# Patient Record
Sex: Male | Born: 1935 | Race: Black or African American | Hispanic: No | Marital: Married | Smoking: Never smoker
Health system: Southern US, Community
[De-identification: ages and names within clinical notes are randomized; demographics above are authoritative.]

## PROBLEM LIST (undated history)

## (undated) DIAGNOSIS — F419 Anxiety disorder, unspecified: Secondary | ICD-10-CM

## (undated) DIAGNOSIS — R0789 Other chest pain: Secondary | ICD-10-CM

## (undated) DIAGNOSIS — I219 Acute myocardial infarction, unspecified: Secondary | ICD-10-CM

## (undated) DIAGNOSIS — E119 Type 2 diabetes mellitus without complications: Secondary | ICD-10-CM

## (undated) DIAGNOSIS — M1990 Unspecified osteoarthritis, unspecified site: Secondary | ICD-10-CM

## (undated) DIAGNOSIS — N400 Enlarged prostate without lower urinary tract symptoms: Secondary | ICD-10-CM

## (undated) HISTORY — PX: LUNG SURGERY: SHX703

## (undated) HISTORY — PX: CHOLECYSTECTOMY: SHX55

---

## 2002-09-23 ENCOUNTER — Emergency Department (HOSPITAL_COMMUNITY): Admission: EM | Admit: 2002-09-23 | Discharge: 2002-09-23 | Payer: Self-pay | Admitting: Emergency Medicine

## 2005-02-04 ENCOUNTER — Emergency Department (HOSPITAL_COMMUNITY): Admission: EM | Admit: 2005-02-04 | Discharge: 2005-02-04 | Payer: Self-pay | Admitting: Emergency Medicine

## 2006-07-08 ENCOUNTER — Ambulatory Visit: Payer: Self-pay | Admitting: Nurse Practitioner

## 2008-07-30 ENCOUNTER — Emergency Department (HOSPITAL_COMMUNITY): Admission: EM | Admit: 2008-07-30 | Discharge: 2008-07-30 | Payer: Self-pay | Admitting: Emergency Medicine

## 2010-03-17 ENCOUNTER — Emergency Department (HOSPITAL_COMMUNITY)
Admission: EM | Admit: 2010-03-17 | Discharge: 2010-03-17 | Payer: Self-pay | Source: Home / Self Care | Admitting: Emergency Medicine

## 2010-03-19 ENCOUNTER — Emergency Department (HOSPITAL_COMMUNITY)
Admission: EM | Admit: 2010-03-19 | Discharge: 2010-03-19 | Payer: Self-pay | Source: Home / Self Care | Admitting: Emergency Medicine

## 2010-03-26 LAB — POCT I-STAT, CHEM 8
BUN: 13 mg/dL (ref 6–23)
Calcium, Ion: 1.15 mmol/L (ref 1.12–1.32)
Chloride: 103 mEq/L (ref 96–112)
Creatinine, Ser: 1 mg/dL (ref 0.4–1.5)
Glucose, Bld: 203 mg/dL — ABNORMAL HIGH (ref 70–99)
HCT: 45 % (ref 39.0–52.0)
Hemoglobin: 15.3 g/dL (ref 13.0–17.0)
Potassium: 4 mEq/L (ref 3.5–5.1)
Sodium: 139 mEq/L (ref 135–145)
TCO2: 28 mmol/L (ref 0–100)

## 2010-06-19 LAB — GLUCOSE, CAPILLARY: Glucose-Capillary: 77 mg/dL (ref 70–99)

## 2013-07-01 ENCOUNTER — Emergency Department (HOSPITAL_COMMUNITY): Payer: Non-veteran care

## 2013-07-01 ENCOUNTER — Observation Stay (HOSPITAL_COMMUNITY)
Admission: EM | Admit: 2013-07-01 | Discharge: 2013-07-02 | Disposition: A | Discharge status: Home / Self Care | Payer: Non-veteran care | Attending: Internal Medicine | Admitting: Internal Medicine

## 2013-07-01 ENCOUNTER — Encounter (HOSPITAL_COMMUNITY): Payer: Self-pay | Admitting: Emergency Medicine

## 2013-07-01 DIAGNOSIS — R5381 Other malaise: Secondary | ICD-10-CM | POA: Insufficient documentation

## 2013-07-01 DIAGNOSIS — Z66 Do not resuscitate: Secondary | ICD-10-CM | POA: Insufficient documentation

## 2013-07-01 DIAGNOSIS — R112 Nausea with vomiting, unspecified: Secondary | ICD-10-CM | POA: Diagnosis present

## 2013-07-01 DIAGNOSIS — F419 Anxiety disorder, unspecified: Secondary | ICD-10-CM | POA: Diagnosis present

## 2013-07-01 DIAGNOSIS — R5383 Other fatigue: Secondary | ICD-10-CM

## 2013-07-01 DIAGNOSIS — R197 Diarrhea, unspecified: Secondary | ICD-10-CM | POA: Diagnosis present

## 2013-07-01 DIAGNOSIS — J189 Pneumonia, unspecified organism: Principal | ICD-10-CM | POA: Diagnosis present

## 2013-07-01 DIAGNOSIS — I10 Essential (primary) hypertension: Secondary | ICD-10-CM | POA: Insufficient documentation

## 2013-07-01 DIAGNOSIS — E119 Type 2 diabetes mellitus without complications: Secondary | ICD-10-CM | POA: Diagnosis present

## 2013-07-01 HISTORY — DX: Anxiety disorder, unspecified: F41.9

## 2013-07-01 HISTORY — DX: Benign prostatic hyperplasia without lower urinary tract symptoms: N40.0

## 2013-07-01 HISTORY — DX: Type 2 diabetes mellitus without complications: E11.9

## 2013-07-01 LAB — CBC WITH DIFFERENTIAL/PLATELET
Basophils Absolute: 0 10*3/uL (ref 0.0–0.1)
Basophils Relative: 0 % (ref 0–1)
Eosinophils Absolute: 0.1 10*3/uL (ref 0.0–0.7)
Eosinophils Relative: 1 % (ref 0–5)
HCT: 36.3 % — ABNORMAL LOW (ref 39.0–52.0)
Hemoglobin: 12.3 g/dL — ABNORMAL LOW (ref 13.0–17.0)
Lymphocytes Relative: 21 % (ref 12–46)
Lymphs Abs: 1.6 10*3/uL (ref 0.7–4.0)
MCH: 30.2 pg (ref 26.0–34.0)
MCHC: 33.9 g/dL (ref 30.0–36.0)
MCV: 89.2 fL (ref 78.0–100.0)
Monocytes Absolute: 0.8 10*3/uL (ref 0.1–1.0)
Monocytes Relative: 10 % (ref 3–12)
Neutro Abs: 5.1 10*3/uL (ref 1.7–7.7)
Neutrophils Relative %: 68 % (ref 43–77)
Platelets: 232 10*3/uL (ref 150–400)
RBC: 4.07 MIL/uL — ABNORMAL LOW (ref 4.22–5.81)
RDW: 12 % (ref 11.5–15.5)
WBC: 7.6 10*3/uL (ref 4.0–10.5)

## 2013-07-01 LAB — URINALYSIS, ROUTINE W REFLEX MICROSCOPIC
Bilirubin Urine: NEGATIVE
Glucose, UA: NEGATIVE mg/dL
Hgb urine dipstick: NEGATIVE
Ketones, ur: NEGATIVE mg/dL
Leukocytes, UA: NEGATIVE
Nitrite: NEGATIVE
Protein, ur: NEGATIVE mg/dL
Specific Gravity, Urine: <1.005 — ABNORMAL LOW (ref 1.005–1.030)
Urobilinogen, UA: 0.2 mg/dL (ref 0.0–1.0)
pH: 6 (ref 5.0–8.0)

## 2013-07-01 LAB — COMPREHENSIVE METABOLIC PANEL
ALT: 14 U/L (ref 0–53)
AST: 19 U/L (ref 0–37)
Albumin: 3.4 g/dL — ABNORMAL LOW (ref 3.5–5.2)
Alkaline Phosphatase: 73 U/L (ref 39–117)
BUN: 7 mg/dL (ref 6–23)
CO2: 29 mEq/L (ref 19–32)
Calcium: 9.4 mg/dL (ref 8.4–10.5)
Chloride: 102 mEq/L (ref 96–112)
Creatinine, Ser: 0.86 mg/dL (ref 0.50–1.35)
GFR calc Af Amer: 82 mL/min — ABNORMAL LOW (ref >90)
GFR calc non Af Amer: 71 mL/min — ABNORMAL LOW (ref >90)
Glucose, Bld: 151 mg/dL — ABNORMAL HIGH (ref 70–99)
Potassium: 3.7 mEq/L (ref 3.7–5.3)
Sodium: 142 mEq/L (ref 137–147)
Total Bilirubin: 0.4 mg/dL (ref 0.3–1.2)
Total Protein: 7.7 g/dL (ref 6.0–8.3)

## 2013-07-01 LAB — GLUCOSE, CAPILLARY
Glucose-Capillary: 181 mg/dL — ABNORMAL HIGH (ref 70–99)
Glucose-Capillary: 115 mg/dL — ABNORMAL HIGH (ref 70–99)

## 2013-07-01 LAB — LIPASE, BLOOD: Lipase: 25 U/L (ref 11–59)

## 2013-07-01 MED ORDER — TAMSULOSIN HCL 0.4 MG PO CAPS: 0.4000 mg | ORAL_CAPSULE | Freq: Every day | ORAL | Status: DC

## 2013-07-01 MED ORDER — SERTRALINE HCL 25 MG PO TABS
12.5000 mg | ORAL_TABLET | Freq: Every day | ORAL | Status: DC
  Administered 2013-07-01 – 2013-07-02 (×2): 12.5 mg via ORAL
  Filled 2013-07-01 (×5): qty 0.5

## 2013-07-01 MED ORDER — DEXTROSE 5 % IV SOLN
1.0000 g | Freq: Once | INTRAVENOUS | Status: AC
  Administered 2013-07-01: 1 g via INTRAVENOUS
  Filled 2013-07-01: qty 10

## 2013-07-01 MED ORDER — SODIUM CHLORIDE 0.9 % IV SOLN
INTRAVENOUS | Status: DC
Start: 2013-07-01 — End: 2013-07-02
  Administered 2013-07-01: 16:00:00 via INTRAVENOUS

## 2013-07-01 MED ORDER — INSULIN ASPART 100 UNIT/ML ~~LOC~~ SOLN
0.0000 [IU] | Freq: Three times a day (TID) | SUBCUTANEOUS | Status: DC
  Administered 2013-07-01: 2 [IU] via SUBCUTANEOUS

## 2013-07-01 MED ORDER — ONDANSETRON HCL 4 MG PO TABS: 4.0000 mg | ORAL_TABLET | Freq: Four times a day (QID) | ORAL | Status: DC | PRN

## 2013-07-01 MED ORDER — ACETAMINOPHEN 650 MG RE SUPP: 650.0000 mg | Freq: Four times a day (QID) | RECTAL | Status: DC | PRN

## 2013-07-01 MED ORDER — FINASTERIDE 5 MG PO TABS: 5.0000 mg | ORAL_TABLET | Freq: Every day | ORAL | Status: DC

## 2013-07-01 MED ORDER — GABAPENTIN 400 MG PO CAPS
400.0000 mg | ORAL_CAPSULE | Freq: Every day | ORAL | Status: DC
  Administered 2013-07-01: 400 mg via ORAL
  Filled 2013-07-01: qty 1

## 2013-07-01 MED ORDER — ALUM & MAG HYDROXIDE-SIMETH 200-200-20 MG/5ML PO SUSP
30.0000 mL | Freq: Four times a day (QID) | ORAL | Status: DC | PRN
  Administered 2013-07-02: 30 mL via ORAL
  Filled 2013-07-01: qty 30

## 2013-07-01 MED ORDER — SODIUM CHLORIDE 0.9 % IV BOLUS (SEPSIS)
500.0000 mL | Freq: Once | INTRAVENOUS | Status: AC
  Administered 2013-07-01: 500 mL via INTRAVENOUS

## 2013-07-01 MED ORDER — FINASTERIDE 5 MG PO TABS
5.0000 mg | ORAL_TABLET | Freq: Every day | ORAL | Status: DC
Start: 2013-07-01 — End: 2013-07-02
  Administered 2013-07-01 – 2013-07-02 (×2): 5 mg via ORAL
  Filled 2013-07-01 (×5): qty 1

## 2013-07-01 MED ORDER — ENOXAPARIN SODIUM 40 MG/0.4ML ~~LOC~~ SOLN
40.0000 mg | SUBCUTANEOUS | Status: DC
  Administered 2013-07-01: 40 mg via SUBCUTANEOUS
  Filled 2013-07-01: qty 0.4

## 2013-07-01 MED ORDER — ONDANSETRON HCL 4 MG/2ML IJ SOLN
4.0000 mg | Freq: Once | INTRAMUSCULAR | Status: AC
  Administered 2013-07-01: 4 mg via INTRAVENOUS
  Filled 2013-07-01: qty 2

## 2013-07-01 MED ORDER — MAGNESIUM CITRATE PO SOLN: 1.0000 | Freq: Once | ORAL | Status: AC | PRN

## 2013-07-01 MED ORDER — ACETAMINOPHEN 325 MG PO TABS: 650.0000 mg | ORAL_TABLET | Freq: Four times a day (QID) | ORAL | Status: DC | PRN

## 2013-07-01 MED ORDER — ONDANSETRON HCL 4 MG/2ML IJ SOLN: 4.0000 mg | Freq: Four times a day (QID) | INTRAMUSCULAR | Status: DC | PRN

## 2013-07-01 MED ORDER — DEXTROSE 5 % IV SOLN
500.0000 mg | Freq: Once | INTRAVENOUS | Status: AC
  Administered 2013-07-01: 500 mg via INTRAVENOUS

## 2013-07-01 MED ORDER — TAMSULOSIN HCL 0.4 MG PO CAPS
0.4000 mg | ORAL_CAPSULE | Freq: Every day | ORAL | Status: DC
  Administered 2013-07-01 – 2013-07-02 (×2): 0.4 mg via ORAL
  Filled 2013-07-01 (×2): qty 1

## 2013-07-01 MED ORDER — HYDROCODONE-ACETAMINOPHEN 5-325 MG PO TABS: 1.0000 | ORAL_TABLET | ORAL | Status: DC | PRN

## 2013-07-01 MED ORDER — SENNA 8.6 MG PO TABS
1.0000 | ORAL_TABLET | Freq: Two times a day (BID) | ORAL | Status: DC
  Administered 2013-07-01 – 2013-07-02 (×3): 8.6 mg via ORAL
  Filled 2013-07-01 (×3): qty 1

## 2013-07-01 MED ORDER — CEFTRIAXONE SODIUM 1 G IJ SOLR
1.0000 g | INTRAMUSCULAR | Status: DC
  Administered 2013-07-02: 1 g via INTRAVENOUS
  Filled 2013-07-01 (×4): qty 10

## 2013-07-01 MED ORDER — INSULIN ASPART 100 UNIT/ML ~~LOC~~ SOLN: 0.0000 [IU] | Freq: Every day | SUBCUTANEOUS | Status: DC

## 2013-07-01 MED ORDER — DEXTROSE 5 % IV SOLN
500.0000 mg | INTRAVENOUS | Status: DC
  Administered 2013-07-02: 500 mg via INTRAVENOUS
  Filled 2013-07-01 (×4): qty 500

## 2013-07-01 MED ORDER — SODIUM CHLORIDE 0.9 % IV BOLUS (SEPSIS)
500.0000 mL | Freq: Once | INTRAVENOUS | Status: AC
Start: 2013-07-01 — End: 2013-07-01
  Administered 2013-07-01: 500 mL via INTRAVENOUS

## 2013-07-01 NOTE — ED Notes (Signed)
Secretary called on 300 to reach RN taking patient. Report finally given.

## 2013-07-01 NOTE — Progress Notes (Signed)
ANTIBIOTIC CONSULT NOTE - INITIAL  Pharmacy Consult for Rocephin Indication: pneumonia  No Known Allergies  Patient Measurements: Height: 5\' 11"  (180.3 cm) Weight: 160 lb (72.576 kg) IBW/kg (Calculated) : 75.3 Adjusted Body Weight:   Vital Signs: Temp: 97.8 F (36.6 C) (04/23 0723) Temp src: Oral (04/23 0723) BP: 145/70 mmHg (04/23 1200) Pulse Rate: 69 (04/23 1200) Intake/Output from previous day:   Intake/Output from this shift:    Labs:  Recent Labs  07/01/13 0736  WBC 7.6  HGB 12.3*  PLT 232  CREATININE 0.86   Estimated Creatinine Clearance: 51.6 ml/min (by C-G formula based on Cr of 0.86). No results found for this basename: VANCOTROUGH, VANCOPEAK, VANCORANDOM, GENTTROUGH, GENTPEAK, GENTRANDOM, TOBRATROUGH, TOBRAPEAK, TOBRARND, AMIKACINPEAK, AMIKACINTROU, AMIKACIN,  in the last 72 hours   Microbiology: No results found for this or any previous visit (from the past 720 hour(s)).  Medical History: Past Medical History  Diagnosis Date  . Diabetes mellitus without complication   . Anxiety   . BPH (benign prostatic hyperplasia)     Medications:  Scheduled:   Assessment: Rocephin for CAP Rocephin 1 GM IV given in ED Azithromycin 500 mg IV given in ED  Goal of Therapy:  Eradicate infection  Plan:  Rocephin 1 GM IV every 24 hours, next dose tomorrow 9 AM Labs per protocol F/U LOT  Blake Hester [employer] 07/01/2013,1:00 PM

## 2013-07-01 NOTE — ED Notes (Addendum)
RN Ginger called to give report put on hold by Charge Nurse Misty Stanley

## 2013-07-01 NOTE — ED Provider Notes (Signed)
CSN: [id number]     Arrival date & time 07/01/13  0702 History  This chart was scribed for Donnetta HutchingBrian Maley Venezia, MD by Quintella ReichertMatthew Underwood, ED scribe.  This patient was seen in room APA09/APA09 and the patient's care was started at 7:23 AM.   Chief Complaint  Patient presents with  . Cough  . Emesis  . Diarrhea    The history is provided by the patient. No language interpreter was used.    HPI Comments: Blake Hester is a 80 y.o. male who presents to the Emergency Department complaining of one week of persistent, worsening cough with associated nausea, vomiting and diarrhea.  Pt also reports he has had no appetite.  He denies abdominal pain.  He has been able to walk using a walker but was brought into the ED via wheelchair with his son.  Pt lives at home with his son. Severity is moderate. No chest pain, fever, chills, rusty sputum.  [facility]   Past Medical History  Diagnosis Date  . Diabetes mellitus without complication   . Anxiety     Past Surgical History  Procedure Laterality Date  . Cholecystectomy      History reviewed. No pertinent family history.   History  Substance Use Topics  . Smoking status: Never Smoker   . Smokeless tobacco: Not on file  . Alcohol Use: No     Review of Systems A complete 10 system review of systems was obtained and all systems are negative except as noted in the HPI and PMH.     Allergies  Review of patient's allergies indicates no known allergies.  Home Medications   Prior to Admission medications   Not on File   BP 154/65  Pulse 75  Temp(Src) 97.8 F (36.6 C) (Oral)  Resp 21  Ht 5\' 11"  (1.803 m)  Wt 160 lb (72.576 kg)  BMI 22.33 kg/m2  SpO2 98%  Physical Exam  Nursing note and vitals reviewed. Constitutional: He is oriented to person, place, and time. He appears well-developed and well-nourished.  HENT:  Head: Normocephalic and atraumatic.  Eyes: Conjunctivae and EOM are normal. Pupils are equal, round, and  reactive to light.  Neck: Normal range of motion. Neck supple.  Cardiovascular: Normal rate, regular rhythm and normal heart sounds.   Pulmonary/Chest: Effort normal and breath sounds normal. No respiratory distress. He has no wheezes. He has no rales.  Abdominal: Soft. Bowel sounds are normal. There is no tenderness.  Musculoskeletal: Normal range of motion.  Neurological: He is alert and oriented to person, place, and time.  Skin: Skin is warm and dry.  Psychiatric: He has a normal mood and affect. His behavior is normal.    ED Course  Procedures (including critical care time)  DIAGNOSTIC STUDIES: Oxygen Saturation is 98% on room air, normal by my interpretation.    COORDINATION OF CARE: 7:25 AM-Discussed treatment plan which includes IV fluids, labs, and CXR with pt at bedside and pt agreed to plan.    Results for orders placed during the hospital encounter of 07/01/13  COMPREHENSIVE METABOLIC PANEL      Result Value Ref Range   Sodium 142  137 - 147 mEq/L   Potassium 3.7  3.7 - 5.3 mEq/L   Chloride 102  96 - 112 mEq/L   CO2 29  19 - 32 mEq/L   Glucose, Bld 151 (*) 70 - 99 mg/dL   BUN 7  6 - 23 mg/dL   Creatinine, Ser  0.86  0.50 - 1.35 mg/dL   Calcium 9.4  8.4 - 38.4 mg/dL   Total Protein 7.7  6.0 - 8.3 g/dL   Albumin 3.4 (*) 3.5 - 5.2 g/dL   AST 19  0 - 37 U/L   ALT 14  0 - 53 U/L   Alkaline Phosphatase 73  39 - 117 U/L   Total Bilirubin 0.4  0.3 - 1.2 mg/dL   GFR calc non Af Amer 71 (*) >90 mL/min   GFR calc Af Amer 82 (*) >90 mL/min  CBC WITH DIFFERENTIAL      Result Value Ref Range   WBC 7.6  4.0 - 10.5 K/uL   RBC 4.07 (*) 4.22 - 5.81 MIL/uL   Hemoglobin 12.3 (*) 13.0 - 17.0 g/dL   HCT 66.5 (*) 99.3 - 57.0 %   MCV 89.2  78.0 - 100.0 fL   MCH 30.2  26.0 - 34.0 pg   MCHC 33.9  30.0 - 36.0 g/dL   RDW 17.7  93.9 - 03.0 %   Platelets 232  150 - 400 K/uL   Neutrophils Relative % 68  43 - 77 %   Neutro Abs 5.1  1.7 - 7.7 K/uL   Lymphocytes Relative 21  12 - 46 %    Lymphs Abs 1.6  0.7 - 4.0 K/uL   Monocytes Relative 10  3 - 12 %   Monocytes Absolute 0.8  0.1 - 1.0 K/uL   Eosinophils Relative 1  0 - 5 %   Eosinophils Absolute 0.1  0.0 - 0.7 K/uL   Basophils Relative 0  0 - 1 %   Basophils Absolute 0.0  0.0 - 0.1 K/uL  LIPASE, BLOOD      Result Value Ref Range   Lipase 25  11 - 59 U/L    Dg Chest 1 View  07/01/2013   CLINICAL DATA:  Cough  EXAM: CHEST - 1 VIEW  COMPARISON:  Prior chest x-ray 03/17/2010  FINDINGS: Stable cardiac and mediastinal contours. Atherosclerotic calcification noted in the transverse aorta. New small left pleural effusion with associated basilar opacity. Negative for pneumothorax or pleural effusion. No suspicious nodule or mass. No acute osseous abnormality.  IMPRESSION: 1. Small left pleural effusion and associated left basilar opacity which may reflect atelectasis or infiltrate. 2. Aortic atherosclerosis.   Electronically Signed   By: Malachy Moan M.D.   On: 07/01/2013 08:04     MDM   Final diagnoses:  CAP (community acquired pneumonia)    Patient is elderly. Chest x-ray suggests a left basilar infiltrate. Rx IV Rocephin, IV Zithromax for community-acquired pneumonia. Admit    I personally performed the services described in this documentation, which was scribed in my presence. The recorded information has been reviewed and is accurate.   Donnetta Hutching, MD 07/01/13 [phone]

## 2013-07-01 NOTE — ED Notes (Signed)
C/o coughing, vomiting and diarrhea times one week.  Denies any pain at present time.  States his chest does hurt sometimes when he coughs.

## 2013-07-01 NOTE — H&P (Signed)
Patient seen and examined. Above note reviewed.  He is a very pleasant 80 year old gentleman who appears younger than his stated age. Patient has been having shortness of breath, cough, nausea vomiting for several days. Chest x-ray indicates possible developing pneumonia. He was started on empiric antibiotics. The patient is already feeling significantly improved. He likely has an element of dehydration which is contributing to his symptoms. We'll continue to monitor the patient overnight. If he continues to improve, anticipate discharge home tomorrow.  Blake Hester

## 2013-07-01 NOTE — ED Notes (Signed)
Spoke with Charge Nurse Misty Stanley bed number put in.

## 2013-07-01 NOTE — H&P (Signed)
[facility] History and Physical  Blake Hester SWN:[id number] DOB: 1915/11/09 DOA: 07/01/2013  Referring physician: cook PCP: PROVIDER NOT IN SYSTEM   Chief Complaint: cough/nausea/vomiting/diarrhe  HPI: Blake Hester is a very pleasant 80 y.o. 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 Associated symptoms include nausea with multiple episodes of vomiting last night as well as diarrhea and decreased by mouth intake. Patient states he was feeling "poorly" for the last several days but last night worsened with the vomiting and diarrhea. He reports no sleep last night. He also reports needing to use his walker to ambulate whereas he usually uses a cane. He denies chest pain palpitations headache visual disturbances numbness or tingling of his extremities. He denies fever chills dysuria hematuria frequency or urgency. He denies abdominal pain. Initial workup in the emergency department includes chest x-ray concerning for possible pneumonia. Basic metabolic panel and complete blood count mostly unremarkable. He is afebrile, hemodynamically stable and not hypoxic. In the emergency department he received 500 mg of Zithromax intravenously, 1 g of Rocephin intravenously, 1 L of normal saline.    Review of Systems:  And point review of systems complete all systems are negative except as indicated in the history of present illness   Past Medical History  Diagnosis Date  . Diabetes mellitus without complication   . Anxiety    Past Surgical History  Procedure Laterality Date  . Cholecystectomy     Social History:  reports that he has never smoked. He does not have any smokeless tobacco history on file. He reports that he does not drink alcohol or use illicit drugs. He is retired. He lives with his son. He usually ambulates with a cane. No Known  Allergies  History reviewed. No pertinent family history. family medical history reviewed and is noncontributory to the admission of this elderly gentleman  Prior to Admission medications   Not on File   Physical Exam: Filed Vitals:   07/01/13 0958  BP: 146/74  Pulse: 70  Temp:   Resp: 20    BP 146/74  Pulse 70  Temp(Src) 97.8 F (36.6 C) (Oral)  Resp 20  Ht 5\' 11"  (1.803 m)  Wt 72.576 kg (160 lb)  BMI 22.33 kg/m2  SpO2 95%  General: Somewhat thin and frail  Appears slightly anxious but in no acute distress Eyes: PERRL, normal lids, irises & conjunctiva ENT: grossly normal hearing, lips & tongue Neck: no LAD, masses or thyromegaly Cardiovascular: RRR, no m/r/g. No LE edema.  Respiratory: Normal effort. Breath sounds are slightly distant but clear. Frequent wet sounding but nonproductive cough during exam. Poor cough effort Abdomen: soft, ntnd positive bowel sounds throughout no guarding or rebound in Skin: no rash or induration seen on limited exam Musculoskeletal: grossly normal tone BUE/BLE Psychiatric: Slightly anxious. 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 Speech is clear however he has no teeth so a somewhat difficult to understand. Oriented x3           Labs on Admission:  Basic Metabolic Panel:  Recent Labs Lab 07/01/13 0736  NA 142  K 3.7  CL 102  CO2 29  GLUCOSE 151*  BUN 7  CREATININE 0.86  CALCIUM 9.4   Liver Function Tests:  Recent Labs Lab 07/01/13 0736  AST 19  ALT 14  ALKPHOS 73  BILITOT 0.4  PROT 7.7  ALBUMIN 3.4*    Recent Labs Lab 07/01/13 0736  LIPASE 25   No results found for this basename: AMMONIA,  in the last 168 hours CBC:  Recent Labs Lab 07/01/13 0736  WBC 7.6  NEUTROABS 5.1  HGB 12.3*  HCT 36.3*  MCV 89.2  PLT 232   Cardiac Enzymes: No results found for this basename: CKTOTAL, CKMB, CKMBINDEX, TROPONINI,  in the last 168 hours  BNP (last 3 results) No  results found for this basename: PROBNP,  in the last 8760 hours CBG: No results found for this basename: GLUCAP,  in the last 168 hours  Radiological Exams on Admission: Dg Chest 1 View  07/01/2013   CLINICAL DATA:  Cough  EXAM: CHEST - 1 VIEW  COMPARISON:  Prior chest x-ray 03/17/2010  FINDINGS: Stable cardiac and mediastinal contours. Atherosclerotic calcification noted in the transverse aorta. New small left pleural effusion with associated basilar opacity. Negative for pneumothorax or pleural effusion. No suspicious nodule or mass. No acute osseous abnormality.  IMPRESSION: 1. Small left pleural effusion and associated left basilar opacity which may reflect atelectasis or infiltrate. 2. Aortic atherosclerosis.   Electronically Signed   By: Malachy MoanHeath  McCullough M.D.   On: 07/01/2013 08:04    EKG:   Assessment/Plan Principal Problem:   CAP (community acquired pneumonia): Will admit to the medical floor. Will continue azithromycin and Rocephin that was started in the emergency department. Will obtain strep pneumo urine antigen and legionella urine antigen. Provide oxygen supplementation as indicated. Active Problems:   Nausea & vomiting: Seems improved during his time in the emergency department. Likely related to #1. Provide diet. Zofran as needed.    Diarrhea: No episode since presenting to the emergency department. No recent antibiotics unusual foods or travel. Suspect it's related to #1. Will monitor and obtained GI pathogen panel.    Diabetes mellitus without complication: Will obtain hemoglobin A1c. Patient reports he takes a "sugar pill". His [facility] care [facility]. Will use sliding scale for optimal glycemic control.    Anxiety: Patient reports he is on and "anxiety" pill at home. Will obtain his home medications from his son Blake Hester. He also verbalizes flashbacks last night of when he was in [place] during or [place]. Very teary during this  discussion. I will continue antianxiety agents from his home medication list    Code Status: DNR.  Family Communication: son Blake Hester at bedside Disposition Plan: home when ready  Time spent: 360  minutes  Lesle ChrisKaren M [facility] Pager [phone]

## 2013-07-01 NOTE — ED Notes (Signed)
Consult to hospitalist

## 2013-07-02 LAB — CBC
HCT: 33.4 % — ABNORMAL LOW (ref 39.0–52.0)
Hemoglobin: 11.1 g/dL — ABNORMAL LOW (ref 13.0–17.0)
MCH: 29.8 pg (ref 26.0–34.0)
MCHC: 33.2 g/dL (ref 30.0–36.0)
MCV: 89.5 fL (ref 78.0–100.0)
PLATELETS: 248 10*3/uL (ref 150–400)
RBC: 3.73 MIL/uL — ABNORMAL LOW (ref 4.22–5.81)
RDW: 12.2 % (ref 11.5–15.5)
WBC: 6.8 10*3/uL (ref 4.0–10.5)

## 2013-07-02 LAB — BASIC METABOLIC PANEL
BUN: 6 mg/dL (ref 6–23)
CALCIUM: 8.9 mg/dL (ref 8.4–10.5)
CO2: 24 mEq/L (ref 19–32)
CREATININE: 0.83 mg/dL (ref 0.50–1.35)
Chloride: 105 mEq/L (ref 96–112)
GFR calc Af Amer: 83 mL/min — ABNORMAL LOW (ref >90)
GFR calc non Af Amer: 72 mL/min — ABNORMAL LOW (ref >90)
GLUCOSE: 121 mg/dL — AB (ref 70–99)
Potassium: 3.7 mEq/L (ref 3.7–5.3)
Sodium: 143 mEq/L (ref 137–147)

## 2013-07-02 LAB — GLUCOSE, CAPILLARY
Glucose-Capillary: 112 mg/dL — ABNORMAL HIGH (ref 70–99)
Glucose-Capillary: 114 mg/dL — ABNORMAL HIGH (ref 70–99)

## 2013-07-02 LAB — HEMOGLOBIN A1C
Hgb A1c MFr Bld: 6.4 % — ABNORMAL HIGH (ref <5.7)
Mean Plasma Glucose: 137 mg/dL — ABNORMAL HIGH (ref <117)

## 2013-07-02 LAB — STREP PNEUMONIAE URINARY ANTIGEN: STREP PNEUMO URINARY ANTIGEN: NEGATIVE

## 2013-07-02 MED ORDER — GABAPENTIN 400 MG PO CAPS: 400.0000 mg | ORAL_CAPSULE | Freq: Every day | ORAL | Status: AC

## 2013-07-02 MED ORDER — LEVOFLOXACIN 750 MG PO TABS: 750.0000 mg | ORAL_TABLET | Freq: Every day | ORAL | Status: DC

## 2013-07-02 MED ORDER — TAMSULOSIN HCL 0.4 MG PO CAPS: 0.4000 mg | ORAL_CAPSULE | Freq: Every day | ORAL | Status: AC

## 2013-07-02 MED ORDER — HYDROCODONE-ACETAMINOPHEN 5-325 MG PO TABS: 1.0000 | ORAL_TABLET | ORAL | Status: AC | PRN

## 2013-07-02 MED ORDER — SERTRALINE HCL 25 MG PO TABS: 12.5000 mg | ORAL_TABLET | Freq: Every day | ORAL | Status: AC

## 2013-07-02 MED ORDER — FINASTERIDE 5 MG PO TABS: 5.0000 mg | ORAL_TABLET | Freq: Every day | ORAL | Status: AC

## 2013-07-02 NOTE — Progress Notes (Signed)
Patient discharged with instructions, prescriptions.  She verbalized understanding via teach back method.  The patient left the floor with staff via w/c in stable condition.

## 2013-07-02 NOTE — Care Management Note (Signed)
    Page 1 of 1   07/02/2013     11:28:03 AM CARE MANAGEMENT NOTE 07/02/2013  Patient:  Blake Hester,Blake Hester   Account Number:  [id number]  Date Initiated:  07/02/2013  Documentation initiated by:  Sharrie Rothman  Subjective/Objective Assessment:   Pt admitted from home with pneumonia. Pt lives alone and has family that lives around him. Pt is fairly independent with ADL's. Pt has a cane, rollator and w/c for home use.     Action/Plan:   Pt for discharge today. No CM needs noted.   Anticipated DC Date:  07/02/2013   Anticipated DC Plan:  HOME/SELF CARE      DC Planning Services  CM consult      Choice offered to / List presented to:             Status of service:  Completed, signed off Medicare Important Message given?   (If response is "NO", the following Medicare IM given date fields will be blank) Date Medicare IM given:   Date Additional Medicare IM given:    Discharge Disposition:  HOME/SELF CARE  Per UR Regulation:    If discussed at Long Length of Stay Meetings, dates discussed:    Comments:  07/02/13 1130 Arlyss Queen, RN BSN CM

## 2013-07-02 NOTE — Discharge Summary (Signed)
Patient seen and examined. Above note reviewed.   Patient is feeling markedly improved today. He denies any shortness of breath. He is in the waiting room air without any difficulty. He has been afebrile. He does not have any significant leukocytosis. Antibiotics were changed to by mouth. Patient will be discharged home today   [facility]

## 2013-07-02 NOTE — Evaluation (Signed)
Physical Therapy Evaluation Patient Details Name: Blake Hester MRN: [id number] DOB: Mar 05, 1916 Today's Date: 07/02/2013   History of Present Illness  Pt is admitted with pneumonia.  He lives with his family and is normally independent at home.  Clinical Impression  Pt was seen for evaluation.  He is an extremely pleasant gentleman who expresses feeling much better today.  He has no significant functional limitations.    Follow Up Recommendations No PT follow up    Equipment Recommendations  None recommended by PT    Recommendations for Other Services   none    Precautions / Restrictions Precautions Precautions: None Restrictions Weight Bearing Restrictions: No      Mobility  Bed Mobility Overal bed mobility: Independent                Transfers Overall transfer level: Modified independent Equipment used: Straight cane                Ambulation/Gait Ambulation/Gait assistance: Modified independent (Device/Increase time) Ambulation Distance (Feet): 175 Feet Assistive device: Straight cane Gait Pattern/deviations: WFL(Within Functional Limits)   Gait velocity interpretation: at or above normal speed for age/gender General Gait Details: pt can lean backward when elevating his arms in stance...pt and family instructed to avoid this positioning                        Balance                                               Home Living Family/patient expects to be discharged to:: Private residence Living Arrangements: Children Available Help at Discharge: Family;Available 24 hours/day Type of Home: House Home Access: Stairs to enter Entrance Stairs-Rails: None Entrance Stairs-Number of Steps: 2 Home Layout: One level Home Equipment: Walker - 4 wheels;Cane - single point;Wheelchair - manual      Prior Function Level of Independence: Independent with assistive device(s)                       Extremity/Trunk  Assessment   Upper Extremity Assessment: Overall WFL for tasks assessed           Lower Extremity Assessment: Overall WFL for tasks assessed         Communication   Communication: No difficulties  Cognition Arousal/Alertness: Awake/alert Behavior During Therapy: WFL for tasks assessed/performed Overall Cognitive Status: Within Functional Limits for tasks assessed                                    Assessment/Plan    PT Assessment Patent does not need any further PT services  PT Diagnosis     PT Problem List    PT Treatment Interventions     PT Goals (Current goals can be found in the Care Plan section) Acute Rehab PT Goals PT Goal Formulation: No goals set, d/c therapy         Barriers to discharge  none                     End of Session Equipment Utilized During Treatment: Gait belt Activity Tolerance: Patient tolerated treatment well Patient left: in bed;with call bell/phone within reach;with bed alarm set      Functional Assessment  Tool Used: clinical judgement Functional Limitation: Mobility: Walking and moving around Mobility: Walking and Moving Around Current Status [phone](G8978): 0 percent impaired, limited or restricted Mobility: Walking and Moving Around Goal Status [phone](G8979): 0 percent impaired, limited or restricted Mobility: Walking and Moving Around Discharge Status [phone](G8980): 0 percent impaired, limited or restricted    Time: 1030-1050 PT Time Calculation (min): 20 min   Charges:   PT Evaluation $Initial PT Evaluation Tier I: 1 Procedure     PT G Codes:   Functional Assessment Tool Used: clinical judgement Functional Limitation: Mobility: Walking and moving around    [facility] GroupClaudia L Jarrah Babich 07/02/2013, 11:08 AM

## 2013-07-02 NOTE — Discharge Summary (Signed)
Physician Discharge Summary  Blake Hester ZOX:[id number] DOB: 1916-01-14 DOA: 07/01/2013  PCP: PROVIDER NOT IN SYSTEM  Admit date: 07/01/2013 Discharge date: 07/02/2013  Time spent: 40 minutes  Recommendations for Outpatient Follow-up:  1. Follow up with PCP at [facility] for evaluation of symptoms   Discharge Diagnoses:  Principal Problem:   CAP (community acquired pneumonia) Active Problems:   Nausea & vomiting   Diarrhea   Diabetes mellitus without complication   Anxiety   Community acquired pneumonia   Discharge Condition: stable  Diet recommendation: regular  Filed Weights   07/01/13 0723 07/01/13 1703  Weight: 72.576 kg (160 lb) 79.289 kg (174 lb 12.8 oz)    History of present illness:  Blake Hester is a very pleasant 80 y.o. World War [employer] veteran who presented to the emergency department with the chief complaint of one-week history of persistent and worsening cough, intermittent shortness of breath, weakness. Associated symptoms included nausea with multiple episodes of vomiting night prior as well as diarrhea and decreased by mouth intake. Patient stated he was feeling "poorly" for the previos several days but  worsened with the vomiting and diarrhea. He reported no sleep last night. He also reported needing to use his walker to ambulate whereas he usually uses a cane. He denied chest pain palpitations headache visual disturbances numbness or tingling of his extremities. He denied fever chills dysuria hematuria frequency or urgency. He denied abdominal pain. Initial workup in the emergency department includes chest x-ray concerning for possible pneumonia. Basic metabolic panel and complete blood count mostly unremarkable. He was afebrile, hemodynamically stable and not hypoxic. In the emergency department he received 500 mg of Zithromax intravenously, 1 g of Rocephin intravenously, 1 L of normal saline.  Hospital Course:  Principal Problem:  CAP (community acquired pneumonia):  admitted to medical floor and provided with IV azithromycin and Rocephin as well as IV fluids. Strep pneumo urine antigen and legionella urine antigen negative. He remained afebrile and non-toxic. Quickly improved. Will discharge with 7 more days of levaquin. Recommend close follow up with PCP in [facility] [place] in 1 week.   Active Problems:  Nausea & vomiting:  Likely related to #1. Resolved at discharge. Tolerating diet.   Diarrhea: No episodes during hospitalization. Suspect it's related to #1.    Diabetes mellitus without complication: A1c 6.4.   Anxiety: stable at baseline   Procedures:  none  Consultations:  none  Discharge Exam: Filed Vitals:   07/02/13 0408  BP: 134/69  Pulse: 66  Temp: 98.1 F (36.7 C)  Resp: 20    General: well nourished smiling NAD Cardiovascular: RRR No MGR No LE edema Respiratory: normal effort BS somewhat distant but clear.   Discharge Instructions You were cared for by a hospitalist during your hospital stay. If you have any questions about your discharge medications or the care you received while you were in the hospital after you are discharged, you can call the unit and asked to speak with the hospitalist on call if the hospitalist that took care of you is not available. Once you are discharged, your primary care physician will handle any further medical issues. Please note that NO REFILLS for any discharge medications will be authorized once you are discharged, as it is imperative that you return to your primary care physician (or establish a relationship with a primary care physician if you do not have one) for your aftercare needs so that they can reassess your need for medications and monitor your lab values.  Discharge Orders   Future Orders Complete By Expires   Diet - low sodium heart healthy  As directed    Discharge instructions  As directed    Increase activity slowly  As directed        Medication List         finasteride 5 MG  tablet  Commonly known as:  PROSCAR  Take 1 tablet (5 mg total) by mouth daily.     gabapentin 400 MG capsule  Commonly known as:  NEURONTIN  Take 1 capsule (400 mg total) by mouth at bedtime.     HYDROcodone-acetaminophen 5-325 MG per tablet  Commonly known as:  NORCO/VICODIN  Take 1-2 tablets by mouth every 4 (four) hours as needed for moderate pain.     levofloxacin 750 MG tablet  Commonly known as:  LEVAQUIN  Take 1 tablet (750 mg total) by mouth daily.     sertraline 25 MG tablet  Commonly known as:  ZOLOFT  Take 0.5 tablets (12.5 mg total) by mouth daily.     tamsulosin 0.4 MG Caps capsule  Commonly known as:  FLOMAX  Take 1 capsule (0.4 mg total) by mouth daily.       No Known Allergies     Follow-up Information   [facility] Schedule an appointment as soon as possible for a visit in 1 week. (follow up on symptoms)        The results of significant diagnostics from this hospitalization (including imaging, microbiology, ancillary and laboratory) are listed below for reference.    Significant Diagnostic Studies: Dg Chest 1 View  07/01/2013   CLINICAL DATA:  Cough  EXAM: CHEST - 1 VIEW  COMPARISON:  Prior chest x-ray 03/17/2010  FINDINGS: Stable cardiac and mediastinal contours. Atherosclerotic calcification noted in the transverse aorta. New small left pleural effusion with associated basilar opacity. Negative for pneumothorax or pleural effusion. No suspicious nodule or mass. No acute osseous abnormality.  IMPRESSION: 1. Small left pleural effusion and associated left basilar opacity which may reflect atelectasis or infiltrate. 2. Aortic atherosclerosis.   Electronically Signed   By: Malachy MoanHeath  McCullough M.D.   On: 07/01/2013 08:04    Microbiology: No results found for this or any previous visit (from the past 240 hour(s)).   Labs: Basic Metabolic Panel:  Recent Labs Lab 07/01/13 0736 07/02/13 0508  NA 142 143  K 3.7 3.7  CL 102 105  CO2  29 24  GLUCOSE 151* 121*  BUN 7 6  CREATININE 0.86 0.83  CALCIUM 9.4 8.9   Liver Function Tests:  Recent Labs Lab 07/01/13 0736  AST 19  ALT 14  ALKPHOS 73  BILITOT 0.4  PROT 7.7  ALBUMIN 3.4*    Recent Labs Lab 07/01/13 0736  LIPASE 25   No results found for this basename: AMMONIA,  in the last 168 hours CBC:  Recent Labs Lab 07/01/13 0736 07/02/13 0508  WBC 7.6 6.8  NEUTROABS 5.1  --   HGB 12.3* 11.1*  HCT 36.3* 33.4*  MCV 89.2 89.5  PLT 232 248   Cardiac Enzymes: No results found for this basename: CKTOTAL, CKMB, CKMBINDEX, TROPONINI,  in the last 168 hours BNP: BNP (last 3 results) No results found for this basename: PROBNP,  in the last 8760 hours CBG:  Recent Labs Lab 07/01/13 1614 07/01/13 2123 07/02/13 0728 07/02/13 1200  GLUCAP 181* 115* 112* 114*       Signed:  Lesle ChrisKaren M Morley Gaumer  [facility] 07/02/2013, 1:03 PM

## 2013-07-04 LAB — LEGIONELLA ANTIGEN, URINE: Legionella Antigen, Urine: NEGATIVE

## 2013-07-09 DIAGNOSIS — I219 Acute myocardial infarction, unspecified: Secondary | ICD-10-CM

## 2013-07-09 HISTORY — DX: Acute myocardial infarction, unspecified: I21.9

## 2013-07-20 ENCOUNTER — Encounter (HOSPITAL_COMMUNITY): Payer: Self-pay | Admitting: Emergency Medicine

## 2013-07-20 ENCOUNTER — Inpatient Hospital Stay (HOSPITAL_COMMUNITY)
Admission: EM | Admit: 2013-07-20 | Discharge: 2013-07-23 | DRG: 282 | Disposition: A | Discharge status: Home / Self Care | Payer: Non-veteran care | Attending: Cardiology | Admitting: Cardiology

## 2013-07-20 ENCOUNTER — Emergency Department (HOSPITAL_COMMUNITY): Payer: Non-veteran care

## 2013-07-20 DIAGNOSIS — N400 Enlarged prostate without lower urinary tract symptoms: Secondary | ICD-10-CM | POA: Diagnosis present

## 2013-07-20 DIAGNOSIS — Z9089 Acquired absence of other organs: Secondary | ICD-10-CM

## 2013-07-20 DIAGNOSIS — E785 Hyperlipidemia, unspecified: Secondary | ICD-10-CM | POA: Diagnosis present

## 2013-07-20 DIAGNOSIS — Z7982 Long term (current) use of aspirin: Secondary | ICD-10-CM

## 2013-07-20 DIAGNOSIS — E119 Type 2 diabetes mellitus without complications: Secondary | ICD-10-CM

## 2013-07-20 DIAGNOSIS — Z602 Problems related to living alone: Secondary | ICD-10-CM

## 2013-07-20 DIAGNOSIS — I214 Non-ST elevation (NSTEMI) myocardial infarction: Secondary | ICD-10-CM | POA: Diagnosis not present

## 2013-07-20 DIAGNOSIS — Z79899 Other long term (current) drug therapy: Secondary | ICD-10-CM

## 2013-07-20 DIAGNOSIS — J189 Pneumonia, unspecified organism: Secondary | ICD-10-CM

## 2013-07-20 DIAGNOSIS — F411 Generalized anxiety disorder: Secondary | ICD-10-CM | POA: Diagnosis present

## 2013-07-20 HISTORY — DX: Acute myocardial infarction, unspecified: I21.9

## 2013-07-20 HISTORY — DX: Other chest pain: R07.89

## 2013-07-20 HISTORY — DX: Unspecified osteoarthritis, unspecified site: M19.90

## 2013-07-20 LAB — CBC WITH DIFFERENTIAL/PLATELET
BASOS ABS: 0 10*3/uL (ref 0.0–0.1)
Basophils Relative: 1 % (ref 0–1)
Eosinophils Absolute: 0.2 10*3/uL (ref 0.0–0.7)
Eosinophils Relative: 3 % (ref 0–5)
HCT: 38.6 % — ABNORMAL LOW (ref 39.0–52.0)
Hemoglobin: 13 g/dL (ref 13.0–17.0)
Lymphocytes Relative: 34 % (ref 12–46)
Lymphs Abs: 2.2 10*3/uL (ref 0.7–4.0)
MCH: 30.2 pg (ref 26.0–34.0)
MCHC: 33.7 g/dL (ref 30.0–36.0)
MCV: 89.8 fL (ref 78.0–100.0)
Monocytes Absolute: 0.4 10*3/uL (ref 0.1–1.0)
Monocytes Relative: 7 % (ref 3–12)
NEUTROS ABS: 3.6 10*3/uL (ref 1.7–7.7)
Neutrophils Relative %: 55 % (ref 43–77)
Platelets: 158 10*3/uL (ref 150–400)
RBC: 4.3 MIL/uL (ref 4.22–5.81)
RDW: 13.3 % (ref 11.5–15.5)
WBC: 6.4 10*3/uL (ref 4.0–10.5)

## 2013-07-20 LAB — COMPREHENSIVE METABOLIC PANEL
ALT: 11 U/L (ref 0–53)
AST: 23 U/L (ref 0–37)
Albumin: 3.7 g/dL (ref 3.5–5.2)
Alkaline Phosphatase: 67 U/L (ref 39–117)
BUN: 15 mg/dL (ref 6–23)
CALCIUM: 9.6 mg/dL (ref 8.4–10.5)
CO2: 29 mEq/L (ref 19–32)
Chloride: 97 mEq/L (ref 96–112)
Creatinine, Ser: 0.97 mg/dL (ref 0.50–1.35)
GFR calc non Af Amer: 67 mL/min — ABNORMAL LOW (ref >90)
GFR, EST AFRICAN AMERICAN: 78 mL/min — AB (ref >90)
Glucose, Bld: 150 mg/dL — ABNORMAL HIGH (ref 70–99)
POTASSIUM: 4.2 meq/L (ref 3.7–5.3)
SODIUM: 138 meq/L (ref 137–147)
TOTAL PROTEIN: 7.4 g/dL (ref 6.0–8.3)
Total Bilirubin: 0.4 mg/dL (ref 0.3–1.2)

## 2013-07-20 LAB — MRSA PCR SCREENING: MRSA BY PCR: INVALID — AB

## 2013-07-20 LAB — GLUCOSE, CAPILLARY
GLUCOSE-CAPILLARY: 90 mg/dL (ref 70–99)
Glucose-Capillary: 160 mg/dL — ABNORMAL HIGH (ref 70–99)

## 2013-07-20 LAB — HEPARIN LEVEL (UNFRACTIONATED)
Heparin Unfractionated: 0.63 IU/mL (ref 0.30–0.70)
Heparin Unfractionated: 0.4 IU/mL (ref 0.30–0.70)

## 2013-07-20 LAB — PROTIME-INR
INR: 0.99 (ref 0.00–1.49)
Prothrombin Time: 12.9 seconds (ref 11.6–15.2)

## 2013-07-20 LAB — TROPONIN I
TROPONIN I: 1.57 ng/mL — AB (ref <0.30)
Troponin I: 0.78 ng/mL (ref <0.30)
Troponin I: 6.84 ng/mL (ref <0.30)
Troponin I: 6.52 ng/mL (ref <0.30)
Troponin I: 8.51 ng/mL (ref <0.30)

## 2013-07-20 MED ORDER — NITROGLYCERIN 2 % TD OINT
0.5000 [in_us] | TOPICAL_OINTMENT | Freq: Four times a day (QID) | TRANSDERMAL | Status: DC
  Administered 2013-07-20 – 2013-07-23 (×11): 0.5 [in_us] via TOPICAL
  Filled 2013-07-20: qty 30

## 2013-07-20 MED ORDER — NITROGLYCERIN 0.4 MG SL SUBL
0.4000 mg | SUBLINGUAL_TABLET | Freq: Once | SUBLINGUAL | Status: AC
  Administered 2013-07-20: 0.4 mg via SUBLINGUAL
  Filled 2013-07-20: qty 1

## 2013-07-20 MED ORDER — NITROGLYCERIN 2 % TD OINT
1.0000 [in_us] | TOPICAL_OINTMENT | Freq: Once | TRANSDERMAL | Status: AC
Start: 2013-07-20 — End: 2013-07-20
  Administered 2013-07-20: 1 [in_us] via TOPICAL
  Filled 2013-07-20: qty 1

## 2013-07-20 MED ORDER — INSULIN ASPART 100 UNIT/ML ~~LOC~~ SOLN
0.0000 [IU] | Freq: Three times a day (TID) | SUBCUTANEOUS | Status: DC
  Administered 2013-07-21 – 2013-07-22 (×3): 1 [IU] via SUBCUTANEOUS
  Administered 2013-07-22: 2 [IU] via SUBCUTANEOUS

## 2013-07-20 MED ORDER — ONDANSETRON HCL 4 MG/2ML IJ SOLN: 4.0000 mg | Freq: Four times a day (QID) | INTRAMUSCULAR | Status: DC | PRN

## 2013-07-20 MED ORDER — ACETAMINOPHEN 325 MG PO TABS: 650.0000 mg | ORAL_TABLET | ORAL | Status: DC | PRN

## 2013-07-20 MED ORDER — SIMVASTATIN 20 MG PO TABS
20.0000 mg | ORAL_TABLET | Freq: Every day | ORAL | Status: DC
  Administered 2013-07-20 – 2013-07-21 (×2): 20 mg via ORAL
  Filled 2013-07-20 (×3): qty 1

## 2013-07-20 MED ORDER — ASPIRIN 300 MG RE SUPP: 300.0000 mg | RECTAL | Status: DC

## 2013-07-20 MED ORDER — INSULIN ASPART 100 UNIT/ML ~~LOC~~ SOLN: 0.0000 [IU] | Freq: Every day | SUBCUTANEOUS | Status: DC

## 2013-07-20 MED ORDER — CHLORHEXIDINE GLUCONATE CLOTH 2 % EX PADS
6.0000 | MEDICATED_PAD | Freq: Every day | CUTANEOUS | Status: DC
  Administered 2013-07-21: 6 via TOPICAL

## 2013-07-20 MED ORDER — ASPIRIN 81 MG PO CHEW: 324.0000 mg | CHEWABLE_TABLET | ORAL | Status: DC

## 2013-07-20 MED ORDER — CLOPIDOGREL BISULFATE 75 MG PO TABS
75.0000 mg | ORAL_TABLET | Freq: Once | ORAL | Status: AC
  Administered 2013-07-20: 75 mg via ORAL
  Filled 2013-07-20: qty 1

## 2013-07-20 MED ORDER — FINASTERIDE 5 MG PO TABS
5.0000 mg | ORAL_TABLET | Freq: Every day | ORAL | Status: DC
  Administered 2013-07-20 – 2013-07-23 (×4): 5 mg via ORAL
  Filled 2013-07-20 (×4): qty 1

## 2013-07-20 MED ORDER — PANTOPRAZOLE SODIUM 40 MG PO TBEC
40.0000 mg | DELAYED_RELEASE_TABLET | Freq: Every day | ORAL | Status: DC
  Administered 2013-07-21 – 2013-07-23 (×3): 40 mg via ORAL
  Filled 2013-07-20 (×3): qty 1

## 2013-07-20 MED ORDER — MUPIROCIN 2 % EX OINT
1.0000 "application " | TOPICAL_OINTMENT | Freq: Two times a day (BID) | CUTANEOUS | Status: DC
  Administered 2013-07-20: 1 via NASAL
  Filled 2013-07-20: qty 22

## 2013-07-20 MED ORDER — HYDROCODONE-ACETAMINOPHEN 5-325 MG PO TABS: 1.0000 | ORAL_TABLET | ORAL | Status: DC | PRN

## 2013-07-20 MED ORDER — GABAPENTIN 400 MG PO CAPS
400.0000 mg | ORAL_CAPSULE | Freq: Every day | ORAL | Status: DC
  Administered 2013-07-20 – 2013-07-22 (×3): 400 mg via ORAL
  Filled 2013-07-20 (×4): qty 1

## 2013-07-20 MED ORDER — TAMSULOSIN HCL 0.4 MG PO CAPS
0.4000 mg | ORAL_CAPSULE | Freq: Every day | ORAL | Status: DC
Start: 2013-07-20 — End: 2013-07-23
  Administered 2013-07-20 – 2013-07-22 (×3): 0.4 mg via ORAL
  Filled 2013-07-20 (×4): qty 1

## 2013-07-20 MED ORDER — METOPROLOL TARTRATE 12.5 MG HALF TABLET
12.5000 mg | ORAL_TABLET | Freq: Once | ORAL | Status: AC
  Administered 2013-07-20: 12.5 mg via ORAL
  Filled 2013-07-20: qty 1

## 2013-07-20 MED ORDER — NITROGLYCERIN 0.4 MG SL SUBL: 0.4000 mg | SUBLINGUAL_TABLET | SUBLINGUAL | Status: DC | PRN

## 2013-07-20 MED ORDER — SODIUM CHLORIDE 0.9 % IV BOLUS (SEPSIS)
250.0000 mL | Freq: Once | INTRAVENOUS | Status: AC
  Administered 2013-07-20: 250 mL via INTRAVENOUS

## 2013-07-20 MED ORDER — HEPARIN BOLUS VIA INFUSION
4000.0000 [IU] | Freq: Once | INTRAVENOUS | Status: AC
  Administered 2013-07-20: 4000 [IU] via INTRAVENOUS

## 2013-07-20 MED ORDER — HEPARIN (PORCINE) IN NACL 100-0.45 UNIT/ML-% IJ SOLN
800.0000 [IU]/h | INTRAMUSCULAR | Status: DC
  Administered 2013-07-20 (×2): 1000 [IU]/h via INTRAVENOUS
  Administered 2013-07-22: 800 [IU]/h via INTRAVENOUS
  Filled 2013-07-20 (×3): qty 250

## 2013-07-20 MED ORDER — SERTRALINE HCL 25 MG PO TABS
12.5000 mg | ORAL_TABLET | Freq: Every day | ORAL | Status: DC
  Administered 2013-07-20 – 2013-07-23 (×4): 12.5 mg via ORAL
  Filled 2013-07-20 (×5): qty 0.5

## 2013-07-20 MED ORDER — ASPIRIN 81 MG PO CHEW
CHEWABLE_TABLET | ORAL | Status: AC
  Filled 2013-07-20: qty 4

## 2013-07-20 MED ORDER — ASPIRIN EC 81 MG PO TBEC
81.0000 mg | DELAYED_RELEASE_TABLET | Freq: Every day | ORAL | Status: DC
  Administered 2013-07-21 – 2013-07-23 (×3): 81 mg via ORAL
  Filled 2013-07-20 (×3): qty 1

## 2013-07-20 MED ORDER — METOPROLOL TARTRATE 12.5 MG HALF TABLET
12.5000 mg | ORAL_TABLET | Freq: Two times a day (BID) | ORAL | Status: DC
  Administered 2013-07-21 – 2013-07-23 (×5): 12.5 mg via ORAL
  Filled 2013-07-20 (×6): qty 1

## 2013-07-20 MED ORDER — ASPIRIN 81 MG PO CHEW
324.0000 mg | CHEWABLE_TABLET | Freq: Once | ORAL | Status: AC
  Administered 2013-07-20: 324 mg via ORAL

## 2013-07-20 NOTE — ED Notes (Signed)
CRITICAL VALUE ALERT  Critical value received:  Troponin 0.78  Date of notification:  07/20/13  Time of notification:  0352  Critical value read back:yes  Nurse who received alert:  Bronson Curb, RN  MD notified (1st page):  Dr Estell Harpin  Time of first page:  [phone]  MD notified (2nd page):  Time of second page:  Responding MD:  Dr. Estell Harpin  Time MD responded:  [phone]

## 2013-07-20 NOTE — Progress Notes (Signed)
07/20/2013 Micro lab called MRSA swab was invalid and swab was sent to be cultured. [facility] RN.

## 2013-07-20 NOTE — ED Notes (Signed)
CRITICAL VALUE ALERT  Critical value received:  Troponin 1.57  Date of notification:  07/20/2013  Time of notification:  0725  Critical value read back:yes  Nurse who received alert:  Tarri Glenn RN   MD notified (1st page):  Dr Estell Harpin  Time of first page:  0725  MD notified (2nd page):  Time of second page:  Responding MD:  Dr Estell Harpin  Time MD responded:  [phone]

## 2013-07-20 NOTE — H&P (Addendum)
Patient ID: Blake Hester MRN: [id number], DOB/AGE: [birth year]   Admit date: 07/20/2013   Primary Physician: PROVIDER NOT IN SYSTEM Primary Cardiologist: Dr Jens Som (new)  HPI:  80 y/o WW[employer] veteran, lives alone [place], his son lives next door, followed at the [facility] with a history of diabetes. The patient is transferred from [facility] after he presented there early this am with SSCP and his Troponin were elevated. He is currently pain free on IV Heparin and NTG paste. He received ASA and Plavix at [facility]. He denies any prior history of CAD but does say he was sent to [facility] 10 yrs ago for chest pain but was only prescribed NTG, "the machine was broke" so he had no functional stress testing.    Problem List: Past Medical History  Diagnosis Date  . Diabetes mellitus without complication   . Anxiety   . BPH (benign prostatic hyperplasia)     Past Surgical History  Procedure Laterality Date  . Cholecystectomy       Allergies: No Known Allergies   Home Medications Current Facility-Administered Medications  Medication Dose Route Frequency Provider Last Rate Last Dose  . heparin ADULT infusion 100 units/mL (25000 units/250 mL)  1,000 Units/hr Intravenous Continuous Benny Lennert, MD 10 mL/hr at 07/20/13 0409 1,000 Units/hr at 07/20/13 0409     Family History  Problem Relation Age of Onset  . Tuberculosis Mother   . Tuberculosis Father      History   Social History  . Marital Status: Married    Spouse Name: N/A    Number of Children: N/A  . Years of Education: N/A   Occupational History  . Not on file.   Social History Main Topics  . Smoking status: Never Smoker   . Smokeless tobacco: Not on file  . Alcohol Use: No  . Drug Use: No  . Sexual Activity: No   Other Topics Concern  . Not on file   Social History Narrative  . No narrative on file     Review of Systems: General: negative for chills, fever, night sweats or weight changes.    Cardiovascular: negative for chest pain, dyspnea on exertion, edema, orthopnea, palpitations, paroxysmal nocturnal dyspnea or shortness of breath Dermatological: negative for rash Respiratory: negative for cough or wheezing Urologic: negative for hematuria Abdominal: negative for nausea, vomiting, diarrhea, bright red blood per rectum, melena, or hematemesis Neurologic: negative for visual changes, syncope, or dizziness Admitted less than 24 hrs 4/23-24 for CAP All other systems reviewed and are otherwise negative except as noted above.  Physical Exam: Blood pressure 148/81, pulse 64, temperature 98.2 F (36.8 C), temperature source Oral, resp. rate 15, height 5\' 10"  (1.778 m), weight 175 lb (79.379 kg), SpO2 100.00%.  General appearance: alert, cooperative, no distress and looks younger than 96 Neck: no carotid bruit and no JVD Lungs: clear to auscultation bilaterally Heart: regular rate and rhythm Abdomen: soft, non-tender; bowel sounds normal; no masses,  no organomegaly and RUQ surgical scar Extremities: extremities normal, atraumatic, no cyanosis or edema Pulses: 2+ and symmetric Skin: Skin color, texture, turgor normal. No rashes or lesions Neurologic: Grossly normal    Labs:   Results for orders placed during the hospital encounter of 07/20/13 (from the past 24 hour(s))  CBC WITH DIFFERENTIAL     Status: Abnormal   Collection Time    07/20/13  3:24 AM      Result Value Ref Range   WBC  6.4  4.0 - 10.5 K/uL   RBC 4.30  4.22 - 5.81 MIL/uL   Hemoglobin 13.0  13.0 - 17.0 g/dL   HCT 45.438.6 (*) 09.839.0 - 11.952.0 %   MCV 89.8  78.0 - 100.0 fL   MCH 30.2  26.0 - 34.0 pg   MCHC 33.7  30.0 - 36.0 g/dL   RDW 14.713.3  82.911.5 - 56.215.5 %   Platelets 158  150 - 400 K/uL   Neutrophils Relative % 55  43 - 77 %   Neutro Abs 3.6  1.7 - 7.7 K/uL   Lymphocytes Relative 34  12 - 46 %   Lymphs Abs 2.2  0.7 - 4.0 K/uL   Monocytes Relative 7  3 - 12 %   Monocytes Absolute 0.4  0.1 - 1.0 K/uL    Eosinophils Relative 3  0 - 5 %   Eosinophils Absolute 0.2  0.0 - 0.7 K/uL   Basophils Relative 1  0 - 1 %   Basophils Absolute 0.0  0.0 - 0.1 K/uL  COMPREHENSIVE METABOLIC PANEL     Status: Abnormal   Collection Time    07/20/13  3:24 AM      Result Value Ref Range   Sodium 138  137 - 147 mEq/L   Potassium 4.2  3.7 - 5.3 mEq/L   Chloride 97  96 - 112 mEq/L   CO2 29  19 - 32 mEq/L   Glucose, Bld 150 (*) 70 - 99 mg/dL   BUN 15  6 - 23 mg/dL   Creatinine, Ser 1.300.97  0.50 - 1.35 mg/dL   Calcium 9.6  8.4 - 86.510.5 mg/dL   Total Protein 7.4  6.0 - 8.3 g/dL   Albumin 3.7  3.5 - 5.2 g/dL   AST 23  0 - 37 U/L   ALT 11  0 - 53 U/L   Alkaline Phosphatase 67  39 - 117 U/L   Total Bilirubin 0.4  0.3 - 1.2 mg/dL   GFR calc non Af Amer 67 (*) >90 mL/min   GFR calc Af Amer 78 (*) >90 mL/min  TROPONIN I     Status: Abnormal   Collection Time    07/20/13  3:24 AM      Result Value Ref Range   Troponin I 0.78 (*) <0.30 ng/mL  TROPONIN I     Status: Abnormal   Collection Time    07/20/13  6:56 AM      Result Value Ref Range   Troponin I 1.57 (*) <0.30 ng/mL  HEPARIN LEVEL (UNFRACTIONATED)     Status: None   Collection Time    07/20/13 11:49 AM      Result Value Ref Range   Heparin Unfractionated 0.63  0.30 - 0.70 IU/mL  TROPONIN I     Status: Abnormal   Collection Time    07/20/13  1:34 PM      Result Value Ref Range   Troponin I 6.84 (*) <0.30 ng/mL     Radiology/Studies: Dg Chest 1 View  Dg Chest Portable 1 View  07/20/2013   CLINICAL DATA:  Chest pain for 2 days.  Shortness of breath.  EXAM: PORTABLE CHEST - 1 VIEW  COMPARISON:  DG CHEST 1 VIEW dated 07/01/2013  FINDINGS: Hyperinflation suggesting emphysema. The heart size and mediastinal contours are within normal limits. Both lungs are clear. The visualized skeletal structures are unremarkable.  IMPRESSION: No active disease.   Electronically Signed   By: Marisa CyphersWilliam  Stevens M.D.  On: 07/20/2013 03:48    EKG:NSR without acute changes,  Qtc 504  ASSESSMENT AND PLAN:  Principal Problem:   NSTEMI (non-ST elevated myocardial infarction) Active Problems:   CAP (community acquired pneumonia)-discharged 07/02/13   Diabetes mellitus without complication   PLAN: Will review with MD-? cath   Signed, Abelino Derrick, PA-C 07/20/2013, 3:12 PM As above, patient seen and examined. Briefly he is a 80 year old male with a past medical history of diabetes mellitus For evaluation of non-ST elevation myocardial infarction. Patient developed substernal chest pain Sunday evening that lasted most of the night. There was associated nausea, dyspnea and diaphoresis. He had recurrent symptoms last evening and was seen at [facility]. Enzymes positive. Electrocardiogram shows sinus rhythm with no significant ST changes. Mildly prolonged QT interval. Presently pain free. Long discussion with patient in the presence of his pastor. Discussed medical therapy versus cardiac catheterization. Patient would prefer conservative measures if possible. We'll treat with aspirin, plavix, beta blocker and statin. Treat with heparin for 48 hours. Discontinue heparin at that point and if no further chest pain will proceed with functional study and if low risk plan medical therapy. If recurrent symptoms or high risk of nuclear study can consider catheterization to identify lesion and minimal to PCI. Check echocardiogram for LV function. Lewayne Bunting

## 2013-07-20 NOTE — ED Notes (Signed)
Pt c/o chest pain x 2 days with intermittent sob.

## 2013-07-20 NOTE — Progress Notes (Addendum)
ANTICOAGULATION CONSULT NOTE - Initial Consult  Pharmacy Consult for heparin Indication: chest pain/ACS  No Known Allergies  Patient Measurements: Height: 5\' 10"  (177.8 cm) Weight: 167 lb 12.3 oz (76.1 kg) IBW/kg (Calculated) : 73 Heparin Dosing Weight: 73 kg  Vital Signs: Temp: 98.2 F (36.8 C) (05/12 1316) Temp src: Oral (05/12 1316) BP: 148/81 mmHg (05/12 1320) Pulse Rate: 64 (05/12 1320)  Labs:  Recent Labs  07/20/13 0324 07/20/13 0656 07/20/13 1149 07/20/13 1334  HGB 13.0  --   --   --   HCT 38.6*  --   --   --   PLT 158  --   --   --   HEPARINUNFRC  --   --  0.63  --   CREATININE 0.97  --   --   --   TROPONINI 0.78* 1.57*  --  6.84*    Estimated Creatinine Clearance: 46 ml/min (by C-G formula based on Cr of 0.97).   Medical History: Past Medical History  Diagnosis Date  . Diabetes mellitus without complication   . Anxiety   . BPH (benign prostatic hyperplasia)     Medications:  Prescriptions prior to admission  Medication Sig Dispense Refill  . finasteride (PROSCAR) 5 MG tablet Take 1 tablet (5 mg total) by mouth daily.      Marland Kitchen gabapentin (NEURONTIN) 400 MG capsule Take 1 capsule (400 mg total) by mouth at bedtime.      Marland Kitchen HYDROcodone-acetaminophen (NORCO/VICODIN) 5-325 MG per tablet Take 1-2 tablets by mouth every 4 (four) hours as needed for moderate pain.  30 tablet  0  . sertraline (ZOLOFT) 25 MG tablet Take 0.5 tablets (12.5 mg total) by mouth daily.      . tamsulosin (FLOMAX) 0.4 MG CAPS capsule Take 1 capsule (0.4 mg total) by mouth daily.  30 capsule    . levofloxacin (LEVAQUIN) 750 MG tablet Take 1 tablet (750 mg total) by mouth daily.  7 tablet  0    Assessment: 80 yo man to continue heparin for CP.  His therapy was initiated at [facility] with a 4000 unit bolus and drip at 1000 units/hr at 04:00 this am.  His baseline Hg is 13 and PTLC 158  Initial heparin level is therapeutic 0.63  Goal of Therapy:  Heparin level 0.3-0.7 units/ml Monitor  platelets by anticoagulation protocol: Yes   Plan:  Continue heparin drip at 1000 units/hr Check heparin level later today to confirm. Daily HL and CBC while on heparin. Monitor for bleeding.  Thanks for allowing pharmacy to be a part of this patient's care.  Talbert Cage, PharmD Clinical Pharmacist, [phone] 07/20/2013,3:39 PM  Addum:  Heparin level remains therapeutic.  Cont drip at present rate and recheck level with am labs

## 2013-07-20 NOTE — Progress Notes (Signed)
CRITICAL VALUE ALERT  Critical value received:  troponin1 6.52  Date of notification:  07/20/2013  Time of notification:  1750  Critical value read back:yes  Nurse who received alert:  Lovie Macadamia RN  MD notified (1st page):  Dr Jens Som  Time of first page:  1756  MD notified (2nd page):  Time of second page:  Responding MD:  Dr Jens Som   Time MD responded:  Windy Fast

## 2013-07-20 NOTE — Progress Notes (Signed)
07/20/2013 patient had a 6 beat run of vtach at 1634 and  Patient was asymptomatic. Did notified Dr Christie Nottingham at 2028. No orders given and continue to monitor patient. Md is also aware of the invalid MRSA swab. [facility] RN.

## 2013-07-20 NOTE — ED Provider Notes (Addendum)
CSN: [id number]     Arrival date & time 07/20/13  0253 History   First MD Initiated Contact with Patient 07/20/13 0308     Chief Complaint  Patient presents with  . Chest Pain     (Consider location/radiation/quality/duration/timing/severity/associated sxs/prior Treatment) Patient is a 80 y.o. male presenting with chest pain. The history is provided by the patient (the pt complains of chest tightness off and on all day).  Chest Pain Pain location:  Substernal area Pain quality: aching   Pain radiates to the back: no   Pain severity:  Moderate Onset quality:  Sudden Timing:  Intermittent Progression:  Waxing and waning Chronicity:  New Associated symptoms: no abdominal pain, no back pain, no cough, no fatigue and no headache     Past Medical History  Diagnosis Date  . Diabetes mellitus without complication   . Anxiety   . BPH (benign prostatic hyperplasia)    Past Surgical History  Procedure Laterality Date  . Cholecystectomy     Family History  Problem Relation Age of Onset  . Tuberculosis Mother   . Tuberculosis Father    History  Substance Use Topics  . Smoking status: Never Smoker   . Smokeless tobacco: Not on file  . Alcohol Use: No    Review of Systems  Constitutional: Negative for appetite change and fatigue.  HENT: Negative for congestion, ear discharge and sinus pressure.   Eyes: Negative for discharge.  Respiratory: Negative for cough.   Cardiovascular: Positive for chest pain.  Gastrointestinal: Negative for abdominal pain and diarrhea.  Genitourinary: Negative for frequency and hematuria.  Musculoskeletal: Negative for back pain.  Skin: Negative for rash.  Neurological: Negative for seizures and headaches.  Psychiatric/Behavioral: Negative for hallucinations.      Allergies  Review of patient's allergies indicates no known allergies.  Home Medications   Prior to Admission medications   Medication Sig Start Date End Date Taking? Authorizing  Provider  finasteride (PROSCAR) 5 MG tablet Take 1 tablet (5 mg total) by mouth daily. 07/02/13  Yes Lesle ChrisKaren M Black, NP  gabapentin (NEURONTIN) 400 MG capsule Take 1 capsule (400 mg total) by mouth at bedtime. 07/02/13  Yes Gwenyth BenderKaren M Black, NP  HYDROcodone-acetaminophen (NORCO/VICODIN) 5-325 MG per tablet Take 1-2 tablets by mouth every 4 (four) hours as needed for moderate pain. 07/02/13  Yes Lesle ChrisKaren M Black, NP  sertraline (ZOLOFT) 25 MG tablet Take 0.5 tablets (12.5 mg total) by mouth daily. 07/02/13  Yes Lesle ChrisKaren M Black, NP  tamsulosin (FLOMAX) 0.4 MG CAPS capsule Take 1 capsule (0.4 mg total) by mouth daily. 07/02/13  Yes Lesle ChrisKaren M Black, NP  levofloxacin (LEVAQUIN) 750 MG tablet Take 1 tablet (750 mg total) by mouth daily. 07/02/13   Gwenyth BenderKaren M Black, NP   BP 131/73  Pulse 67  Temp(Src) 98.4 F (36.9 C) (Oral)  Resp 11  Ht 5\' 10"  (1.778 m)  Wt 175 lb (79.379 kg)  BMI 25.11 kg/m2  SpO2 100% Physical Exam  Constitutional: He is oriented to person, place, and time. He appears well-developed.  HENT:  Head: Normocephalic.  Eyes: Conjunctivae and EOM are normal. No scleral icterus.  Neck: Neck supple. No thyromegaly present.  Cardiovascular: Normal rate and regular rhythm.  Exam reveals no gallop and no friction rub.   No murmur heard. Pulmonary/Chest: No stridor. He has no wheezes. He has no rales. He exhibits no tenderness.  Abdominal: He exhibits no distension. There is no tenderness. There is no rebound.  Musculoskeletal: Normal range  of motion. He exhibits no edema.  Lymphadenopathy:    He has no cervical adenopathy.  Neurological: He is oriented to person, place, and time. He exhibits normal muscle tone. Coordination normal.  Skin: No rash noted. No erythema.  Psychiatric: He has a normal mood and affect. His behavior is normal.    ED Course  Procedures (including critical care time) Labs Review Labs Reviewed  CBC WITH DIFFERENTIAL - Abnormal; Notable for the following:    HCT 38.6 (*)     All other components within normal limits  COMPREHENSIVE METABOLIC PANEL - Abnormal; Notable for the following:    Glucose, Bld 150 (*)    GFR calc non Af Amer 67 (*)    GFR calc Af Amer 78 (*)    All other components within normal limits  TROPONIN I - Abnormal; Notable for the following:    Troponin I 0.78 (*)    All other components within normal limits    Imaging Review Dg Chest Portable 1 View  07/20/2013   CLINICAL DATA:  Chest pain for 2 days.  Shortness of breath.  EXAM: PORTABLE CHEST - 1 VIEW  COMPARISON:  DG CHEST 1 VIEW dated 07/01/2013  FINDINGS: Hyperinflation suggesting emphysema. The heart size and mediastinal contours are within normal limits. Both lungs are clear. The visualized skeletal structures are unremarkable.  IMPRESSION: No active disease.   Electronically Signed   By: Burman Nieves M.D.   On: 07/20/2013 03:48     EKG Interpretation   Date/Time:  Tuesday Jul 20 2013 03:07:56 EDT Ventricular Rate:  74 PR Interval:  191 QRS Duration: 104 QT Interval:  454 QTC Calculation: 504 R Axis:   37 Text Interpretation:  Sinus rhythm Prolonged QT interval Confirmed by  Curby Carswell  MD, Jomarie Longs [phone]) on 07/20/2013 4:00:33 AM    I spoke with cardiology and [facility]     CRITICAL CARE Performed by: Benny Lennert Total critical care time: 35 Critical care time was exclusive of separately billable procedures and treating other patients. Critical care was necessary to treat or prevent imminent or life-threatening deterioration. Critical care was time spent personally by me on the following activities: development of treatment plan with patient and/or surrogate as well as nursing, discussions with consultants, evaluation of patient's response to treatment, examination of patient, obtaining history from patient or surrogate, ordering and performing treatments and interventions, ordering and review of laboratory studies, ordering and review of  radiographic studies, pulse oximetry and re-evaluation of patient's condition.  MDM   Final diagnoses:  NSTEMI (non-ST elevated myocardial infarction)     Pt reevaluated at 735am and he no longer had pain or pressure in his chest   Benny Lennert, MD 07/20/13 0425  Benny Lennert, MD 07/20/13 [phone]

## 2013-07-20 NOTE — Progress Notes (Signed)
07/20/2013 patient transfer from [facility] to 2 central via care link at 1500. He is alert, oriented and up with assist. Patient stated he is a little. Patient was on Heparin drip at 10, and have  nitro pace on left chest. Patient stated he's is not in pain when arrive to the floor and continue to be pain free. [facility] RN.

## 2013-07-21 DIAGNOSIS — I517 Cardiomegaly: Secondary | ICD-10-CM

## 2013-07-21 DIAGNOSIS — E785 Hyperlipidemia, unspecified: Secondary | ICD-10-CM | POA: Diagnosis present

## 2013-07-21 DIAGNOSIS — J189 Pneumonia, unspecified organism: Secondary | ICD-10-CM

## 2013-07-21 DIAGNOSIS — E119 Type 2 diabetes mellitus without complications: Secondary | ICD-10-CM | POA: Diagnosis not present

## 2013-07-21 DIAGNOSIS — I214 Non-ST elevation (NSTEMI) myocardial infarction: Secondary | ICD-10-CM | POA: Diagnosis not present

## 2013-07-21 LAB — HEPARIN LEVEL (UNFRACTIONATED): Heparin Unfractionated: 0.56 IU/mL (ref 0.30–0.70)

## 2013-07-21 LAB — LIPID PANEL
Cholesterol: 249 mg/dL — ABNORMAL HIGH (ref 0–200)
HDL: 59 mg/dL (ref >39)
LDL Cholesterol: 152 mg/dL — ABNORMAL HIGH (ref 0–99)
Total CHOL/HDL Ratio: 4.2 RATIO
Triglycerides: 191 mg/dL — ABNORMAL HIGH (ref <150)
VLDL: 38 mg/dL (ref 0–40)

## 2013-07-21 LAB — GLUCOSE, CAPILLARY
GLUCOSE-CAPILLARY: 106 mg/dL — AB (ref 70–99)
GLUCOSE-CAPILLARY: 128 mg/dL — AB (ref 70–99)
GLUCOSE-CAPILLARY: 166 mg/dL — AB (ref 70–99)
Glucose-Capillary: 128 mg/dL — ABNORMAL HIGH (ref 70–99)

## 2013-07-21 LAB — BASIC METABOLIC PANEL
BUN: 14 mg/dL (ref 6–23)
CO2: 26 mEq/L (ref 19–32)
Calcium: 9.2 mg/dL (ref 8.4–10.5)
Chloride: 102 mEq/L (ref 96–112)
Creatinine, Ser: 0.73 mg/dL (ref 0.50–1.35)
GFR calc Af Amer: 88 mL/min — ABNORMAL LOW (ref >90)
GFR calc non Af Amer: 76 mL/min — ABNORMAL LOW (ref >90)
Glucose, Bld: 97 mg/dL (ref 70–99)
Potassium: 4.4 mEq/L (ref 3.7–5.3)
Sodium: 140 mEq/L (ref 137–147)

## 2013-07-21 LAB — TROPONIN I: Troponin I: 4.02 ng/mL (ref <0.30)

## 2013-07-21 NOTE — Progress Notes (Signed)
    Subjective:  No chest pain or SOB  Objective:  Vital Signs in the last 24 hours: Temp:  [98 F (36.7 C)-98.2 F (36.8 C)] 98.1 F (36.7 C) (05/13 0800) Pulse Rate:  [61-74] 70 (05/13 0938) Resp:  [9-25] 16 (05/13 0800) BP: (104-149)/(52-97) 104/52 mmHg (05/13 0938) SpO2:  [100 %] 100 % (05/13 0800) Weight:  [167 lb 12.3 oz (76.1 kg)] 167 lb 12.3 oz (76.1 kg) (05/12 1514)  Intake/Output from previous day:  Intake/Output Summary (Last 24 hours) at 07/21/13 1138 Last data filed at 07/21/13 0900  Gross per 24 hour  Intake    630 ml  Output    875 ml  Net   -245 ml    Physical Exam: General appearance: alert, cooperative, no distress and looks younger than 79 Lungs: clear to auscultation bilaterally Heart: regular rate and rhythm   Rate: 62  Rhythm: normal sinus rhythm  Lab Results:  Recent Labs  07/20/13 0324  WBC 6.4  HGB 13.0  PLT 158    Recent Labs  07/20/13 0324 07/21/13 0210  NA 138 140  K 4.2 4.4  CL 97 102  CO2 29 26  GLUCOSE 150* 97  BUN 15 14  CREATININE 0.97 0.73    Recent Labs  07/20/13 1650 07/20/13 2159  TROPONINI 6.52* 8.51*    Recent Labs  07/20/13 1650  INR 0.99    Imaging: Imaging results have been reviewed  Cardiac Studies:  Assessment/Plan:  80 y/o transferred from [facility] with NSTEMI 07/20/13. He has been stable. Plan is for 48 hrs of Heparin then consider Myoview. Echo ordered- statin, ASA, low dose beta blocker, nitrate added.    Principal Problem:   NSTEMI (non-ST elevated myocardial infarction) Active Problems:   CAP (community acquired pneumonia)-discharged 07/02/13   Diabetes mellitus without complication   Dyslipidemia- LDL 152    PLAN: Troponin bumped up to 8.5 last night, will re check this am. Continue Heparin till 5/14. Keep in step down one more day. Get OOB today.   Corine Shelter PA-C Beeper [phone] 07/21/2013, 11:38 AM   Patinet seen and examined  I agree with findings as noted above.  Patient is  currently pain free, has been since yesterday  Discussed plans,.  He does not want stress testing or cath  "I am too old for that"   Will max medical care.    Pricilla Riffle

## 2013-07-21 NOTE — Progress Notes (Signed)
  Echocardiogram 2D Echocardiogram has been performed.  Blake Hester 07/21/2013, 12:16 PM

## 2013-07-21 NOTE — Progress Notes (Signed)
ANTICOAGULATION CONSULT NOTE - Follow Up Consult  Pharmacy Consult for Heparin Indication: chest pain/ACS  No Known Allergies  Patient Measurements: Height: 5\' 10"  (177.8 cm) Weight: 167 lb 12.3 oz (76.1 kg) IBW/kg (Calculated) : 73 Heparin Dosing Weight: 76kg  Vital Signs: Temp: 98.1 F (36.7 C) (05/13 0800) Temp src: Oral (05/13 0800) BP: 104/52 mmHg (05/13 0938) Pulse Rate: 70 (05/13 0938)  Labs:  Recent Labs  07/20/13 0324  07/20/13 1149 07/20/13 1334 07/20/13 1650 07/20/13 2159 07/21/13 0210 07/21/13 1050  HGB 13.0  --   --   --   --   --   --   --   HCT 38.6*  --   --   --   --   --   --   --   PLT 158  --   --   --   --   --   --   --   LABPROT  --   --   --   --  12.9  --   --   --   INR  --   --   --   --  0.99  --   --   --   HEPARINUNFRC  --   --  0.63  --   --  0.40  --  0.56  CREATININE 0.97  --   --   --   --   --  0.73  --   TROPONINI 0.78*  < >  --  6.84* 6.52* 8.51*  --   --   < > = values in this interval not displayed.  Estimated Creatinine Clearance: 55.8 ml/min (by C-G formula based on Cr of 0.73).   Medications:  Heparin @ 1000 units/hr  Assessment: Blake Hester continues on heparin for chest pain. Heparin level is therapeutic. Noted plan to continue heparin x 48 hours (started 0400 on 5/12). No bleeding reported.  Goal of Therapy:  Heparin level 0.3-0.7 units/ml Monitor platelets by anticoagulation protocol: Yes   Plan:  1) Continue heparin at 1000 units/hr 2) Follow up heparin level and CBC in AM  Hessie Diener Soyla Bainter 07/21/2013,12:16 PM

## 2013-07-21 NOTE — Progress Notes (Signed)
Out of bed with 2 assist.  Able to take few steps without difficulty.

## 2013-07-21 NOTE — Progress Notes (Signed)
I discussed invalid MRSA pcr results with Hughes Better, IP.  Decision made that since patient has no hx of MRSA, contact precautions can be discontinued until MRSA culture results.  Relayed info to [employer], Charity fundraiser.

## 2013-07-21 NOTE — Progress Notes (Signed)
Patient's MRSA PCR result came invalid and I spoke with Infection control nurse, Ms. Sissy Hoff (after she verified with her Supervisor),  and she stated that  we can wait for the MRSA culture result.  Patient has no history of MRSA.

## 2013-07-22 DIAGNOSIS — E119 Type 2 diabetes mellitus without complications: Secondary | ICD-10-CM

## 2013-07-22 DIAGNOSIS — E785 Hyperlipidemia, unspecified: Secondary | ICD-10-CM | POA: Diagnosis not present

## 2013-07-22 DIAGNOSIS — I214 Non-ST elevation (NSTEMI) myocardial infarction: Secondary | ICD-10-CM | POA: Diagnosis not present

## 2013-07-22 LAB — CBC
HCT: 37.1 % — ABNORMAL LOW (ref 39.0–52.0)
Hemoglobin: 12.3 g/dL — ABNORMAL LOW (ref 13.0–17.0)
MCH: 29.9 pg (ref 26.0–34.0)
MCHC: 33.2 g/dL (ref 30.0–36.0)
MCV: 90.3 fL (ref 78.0–100.0)
Platelets: 156 10*3/uL (ref 150–400)
RBC: 4.11 MIL/uL — ABNORMAL LOW (ref 4.22–5.81)
RDW: 13.2 % (ref 11.5–15.5)
WBC: 7.1 10*3/uL (ref 4.0–10.5)

## 2013-07-22 LAB — MRSA CULTURE

## 2013-07-22 LAB — GLUCOSE, CAPILLARY
GLUCOSE-CAPILLARY: 130 mg/dL — AB (ref 70–99)
GLUCOSE-CAPILLARY: 98 mg/dL (ref 70–99)
GLUCOSE-CAPILLARY: 125 mg/dL — AB (ref 70–99)
GLUCOSE-CAPILLARY: 145 mg/dL — AB (ref 70–99)

## 2013-07-22 LAB — HEPARIN LEVEL (UNFRACTIONATED): HEPARIN UNFRACTIONATED: 0.98 [IU]/mL — AB (ref 0.30–0.70)

## 2013-07-22 MED ORDER — ENOXAPARIN SODIUM 30 MG/0.3ML ~~LOC~~ SOLN
30.0000 mg | SUBCUTANEOUS | Status: DC
Start: 2013-07-22 — End: 2013-07-23
  Administered 2013-07-23: 30 mg via SUBCUTANEOUS
  Filled 2013-07-22 (×2): qty 0.3

## 2013-07-22 MED ORDER — ATORVASTATIN CALCIUM 20 MG PO TABS
20.0000 mg | ORAL_TABLET | Freq: Every day | ORAL | Status: DC
  Administered 2013-07-22: 20 mg via ORAL
  Filled 2013-07-22 (×2): qty 1
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
## 2013-07-22 NOTE — Progress Notes (Signed)
Report called to RN on 2W36. Rehab currently working with patient will be transferred as soon as rehab is done.

## 2013-07-22 NOTE — Progress Notes (Signed)
ANTICOAGULATION CONSULT NOTE - Follow Up Consult  Pharmacy Consult for Heparin Indication: chest pain/ACS  No Known Allergies  Patient Measurements: Height: 5\' 10"  (177.8 cm) Weight: 167 lb 8.8 oz (76 kg) IBW/kg (Calculated) : 73 Heparin Dosing Weight: 76kg  Vital Signs: Temp: 98 F (36.7 C) (05/14 0400) Temp src: Oral (05/14 0400) BP: 98/57 mmHg (05/14 0400) Pulse Rate: 63 (05/14 0400)  Labs:  Recent Labs  07/20/13 0324  07/20/13 1149 07/20/13 1334 07/20/13 1650 07/20/13 2159 07/21/13 0210 07/21/13 1050 07/21/13 1234 07/22/13 0344  HGB 13.0  --   --   --   --   --   --   --   --  12.3*  HCT 38.6*  --   --   --   --   --   --   --   --  37.1*  PLT 158  --   --   --   --   --   --   --   --  156  LABPROT  --   --   --   --  12.9  --   --   --   --   --   INR  --   --   --   --  0.99  --   --   --   --   --   HEPARINUNFRC  --   < > 0.63  --   --  0.40  --  0.56  --  0.98*  CREATININE 0.97  --   --   --   --   --  0.73  --   --   --   TROPONINI 0.78*  < >  --  6.84* 6.52* 8.51*  --   --  4.02*  --   < > = values in this interval not displayed.  Estimated Creatinine Clearance: 55.8 ml/min (by C-G formula based on Cr of 0.73).   Medications:  Heparin @ 1000 units/hr  Assessment: 89yom continues on heparin for chest pain. Heparin level supratherapeutic. Noted plan to continue heparin x 48 hours (started 0400 on 5/12) so should be discontinuing today. No bleeding reported.  Goal of Therapy:  Heparin level 0.3-0.7 units/ml Monitor platelets by anticoagulation protocol: Yes   Plan:  1) Decrease heparin to 800 units/hr 2) Follow up 8 hr heparin level or heparin d/c  Christoper Fabian, PharmD, BCPS Clinical pharmacist, pager [phone] 07/22/2013,5:03 AM

## 2013-07-22 NOTE — Progress Notes (Signed)
CARDIAC REHAB PHASE I   PRE:  Rate/Rhythm: 72 SR  BP:  Supine: 110/78  Sitting:   Standing:    SaO2: 95 RA  MODE:  Ambulation: 288 ft   POST:  Rate/Rhythm: 84 SR  BP:  Supine:   Sitting: 121/75  Standing:    SaO2: 98 RA 1045-1130 Assisted X 2 and used walker to ambulate. Gait steady with walker. Pt able to walk 288 feet without c/o of cp or SOB. Pt to w/c after walk going to new room on  2W.  Melina Copa RN 07/22/2013 11:36 AM

## 2013-07-22 NOTE — Progress Notes (Signed)
Subjective: No CP  No SOB   Objective: Filed Vitals:   07/21/13 1958 07/21/13 2313 07/22/13 0000 07/22/13 0400  BP: 105/88 112/56 114/56 98/57  Pulse: 72 66 61 63  Temp: 98.2 F (36.8 C)  98.2 F (36.8 C) 98 F (36.7 C)  TempSrc: Oral  Oral Oral  Resp: 18  12 10   Height:      Weight:    167 lb 8.8 oz (76 kg)  SpO2: 98%  99% 96%   Weight change: -3.5 oz (-0.1 kg)  Intake/Output Summary (Last 24 hours) at 07/22/13 0751 Last data filed at 07/22/13 0400  Gross per 24 hour  Intake   1260 ml  Output   1750 ml  Net   -490 ml    General: Alert, awake, oriented x3, in no acute distress Neck:  JVP is normal Heart: Regular rate and rhythm, without murmurs, rubs, gallops.  Lungs: Clear to auscultation.  No rales or wheezes. Exemities:  No edema.   Neuro: Grossly intact, nonfocal.  Tele:  SR   Lab Results: Results for orders placed during the hospital encounter of 07/20/13 (from the past 24 hour(s))  GLUCOSE, CAPILLARY     Status: Abnormal   Collection Time    07/21/13  8:04 AM      Result Value Ref Range   Glucose-Capillary 106 (*) 70 - 99 mg/dL  HEPARIN LEVEL (UNFRACTIONATED)     Status: None   Collection Time    07/21/13 10:50 AM      Result Value Ref Range   Heparin Unfractionated 0.56  0.30 - 0.70 IU/mL  GLUCOSE, CAPILLARY     Status: Abnormal   Collection Time    07/21/13 11:41 AM      Result Value Ref Range   Glucose-Capillary 128 (*) 70 - 99 mg/dL  TROPONIN I     Status: Abnormal   Collection Time    07/21/13 12:34 PM      Result Value Ref Range   Troponin I 4.02 (*) <0.30 ng/mL  GLUCOSE, CAPILLARY     Status: Abnormal   Collection Time    07/21/13  5:27 PM      Result Value Ref Range   Glucose-Capillary 128 (*) 70 - 99 mg/dL  GLUCOSE, CAPILLARY     Status: Abnormal   Collection Time    07/21/13  9:08 PM      Result Value Ref Range   Glucose-Capillary 166 (*) 70 - 99 mg/dL  HEPARIN LEVEL (UNFRACTIONATED)     Status: Abnormal   Collection Time   07/22/13  3:44 AM      Result Value Ref Range   Heparin Unfractionated 0.98 (*) 0.30 - 0.70 IU/mL  CBC     Status: Abnormal   Collection Time    07/22/13  3:44 AM      Result Value Ref Range   WBC 7.1  4.0 - 10.5 K/uL   RBC 4.11 (*) 4.22 - 5.81 MIL/uL   Hemoglobin 12.3 (*) 13.0 - 17.0 g/dL   HCT 16.137.1 (*) 09.639.0 - 04.552.0 %   MCV 90.3  78.0 - 100.0 fL   MCH 29.9  26.0 - 34.0 pg   MCHC 33.2  30.0 - 36.0 g/dL   RDW 40.913.2  81.111.5 - 91.415.5 %   Platelets 156  150 - 400 K/uL  GLUCOSE, CAPILLARY     Status: Abnormal   Collection Time    07/22/13  7:40 AM      Result Value Ref  Range   Glucose-Capillary 130 (*) 70 - 99 mg/dL    Studies/Results: 1.61.09  Echo    - Left ventricle: The cavity size was normal. Wall thickness was increased in a pattern of mild LVH. The estimated ejection fraction was 60%. Wall motion was normal; there were no regional wall motion abnormalities. Doppler parameters are consistent with abnormal left ventricular relaxation (grade 1 diastolic dysfunction). - Aortic valve: Sclerosis without stenosis. No significant regurgitation. - Right ventricle: I think RV is probably OK. Not able to assess RV function due to poor visualization. Not able to assess RV size due to poor visualization.  ------------------------------------------------------------  ------------------------------------------------------------ Left ventricle: The cavity size was normal. Wall thickness was increased in a pattern of mild LVH. The estimated ejection fraction was 60%. Wall motion was normal; there were no regional wall motion abnormalities. Doppler parameters are consistent with abnormal left ventricular relaxation (grade 1 diastolic dysfunction).  ------------------------------------------------------------ Aortic valve: Sclerosis without stenosis. Doppler: No significant regurgitation.  ------------------------------------------------------------ Aorta: Aortic root: The aortic root  was normal in size.  ------------------------------------------------------------ Mitral valve: Mildly thickened leaflets . Doppler: No significant regurgitation.  ------------------------------------------------------------ Left atrium: The atrium was at the upper limits of normal in size.  ------------------------------------------------------------ Right ventricle: I think RV is probably OK. Not able to assess RV function due to poor visualization. Not able to assess RV size due to poor visualization.  ------------------------ Medications: Reviewed   @PROBHOSP @  1  NSTEMI  Patient  Doing well  Echo shows normal LV function    Will transfer to floor  Contact cardiac rehab Slowly ambulate  2.  HL  Statin started.    LOS: 2 days   Pricilla Riffle 07/22/2013, 7:51 AM

## 2013-07-22 NOTE — Progress Notes (Signed)
Utilization review completed. Rashana Andrew, RN, BSN. 

## 2013-07-23 DIAGNOSIS — E785 Hyperlipidemia, unspecified: Secondary | ICD-10-CM | POA: Diagnosis not present

## 2013-07-23 DIAGNOSIS — I214 Non-ST elevation (NSTEMI) myocardial infarction: Secondary | ICD-10-CM | POA: Diagnosis not present

## 2013-07-23 LAB — CBC
HCT: 35.6 % — ABNORMAL LOW (ref 39.0–52.0)
Hemoglobin: 11.7 g/dL — ABNORMAL LOW (ref 13.0–17.0)
MCH: 30 pg (ref 26.0–34.0)
MCHC: 32.9 g/dL (ref 30.0–36.0)
MCV: 91.3 fL (ref 78.0–100.0)
Platelets: 140 10*3/uL — ABNORMAL LOW (ref 150–400)
RBC: 3.9 MIL/uL — ABNORMAL LOW (ref 4.22–5.81)
RDW: 13.3 % (ref 11.5–15.5)
WBC: 5.3 10*3/uL (ref 4.0–10.5)

## 2013-07-23 LAB — GLUCOSE, CAPILLARY
Glucose-Capillary: 120 mg/dL — ABNORMAL HIGH (ref 70–99)
Glucose-Capillary: 111 mg/dL — ABNORMAL HIGH (ref 70–99)

## 2013-07-23 MED ORDER — PANTOPRAZOLE SODIUM 40 MG PO TBEC: 40.0000 mg | DELAYED_RELEASE_TABLET | Freq: Every day | ORAL | Status: DC

## 2013-07-23 MED ORDER — METOPROLOL TARTRATE 25 MG PO TABS: 12.5000 mg | ORAL_TABLET | Freq: Two times a day (BID) | ORAL | Status: AC

## 2013-07-23 MED ORDER — ASPIRIN 81 MG PO TBEC
81.0000 mg | DELAYED_RELEASE_TABLET | Freq: Every day | ORAL | Status: AC
Start: 2013-07-23 — End: ?

## 2013-07-23 MED ORDER — NITROGLYCERIN 0.4 MG SL SUBL: 0.4000 mg | SUBLINGUAL_TABLET | SUBLINGUAL | Status: AC | PRN

## 2013-07-23 MED ORDER — ATORVASTATIN CALCIUM 20 MG PO TABS: 20.0000 mg | ORAL_TABLET | Freq: Every day | ORAL | Status: DC

## 2013-07-23 NOTE — Discharge Summary (Signed)
Physician Discharge Summary     Patient ID: Blake Hester MRN: [id number] DOB/AGE: 78/08/1915 78 y.o.  Admit date: 07/20/2013 Discharge date: 07/23/2013  Cardiologist: K[place]waren(New)  Admission Diagnoses:  NSTEMI  Discharge Diagnoses:  Principal Problem:   NSTEMI (non-ST elevated myocardial infarction) Active Problems:   CAP (community acquired pneumonia)-discharged 07/02/13   Diabetes mellitus without complication   Dyslipidemia- LDL 152   Discharged Condition: stable  Hospital Course:   80 y/o WW[employer] veteran, [place], his son lives next door, [facility] a [facility] of [facility]. [facility] with SSCP and his Troponin was elevated. He is currently pain free on IV Heparin and NTG paste. [facility] He denies any prior history of CAD but does say [facility] for chest pain but was only prescribed NTG, "the machine was broke" so he had no functional stress testing.   The patient was admitted.  Troponin peaked at 8.51.  Heparin was continued until 5/14.  The patient did not want stress testing or cath stating, "I am too old for that."  2D echo revealed an EF of 60% with normal wall motion.  He is on asa, beta blocker and statin.   The patient was seen by Dr. Tenny Crawoss who felt he was stable for DC home.  Follow up arranged.    Consults: None  Significant Diagnostic Studies:  PORTABLE CHEST - 1 VIEW  COMPARISON: DG CHEST 1 VIEW dated 07/01/2013  FINDINGS: Hyperinflation suggesting emphysema. The heart size and mediastinal contours are within normal limits. Both lungs are clear. The visualized skeletal structures are unremarkable.  IMPRESSION: No active disease.  Echo-Study Conclusions  - Left ventricle: The cavity size was normal. Wall thickness was increased in a pattern of mild LVH. The estimated ejection fraction was 60%. Wall  motion was normal; there were no regional wall motion abnormalities. Doppler parameters are consistent with abnormal left ventricular relaxation (grade 1 diastolic dysfunction). - Aortic valve: Sclerosis without stenosis. No significant regurgitation. - Right ventricle: I think RV is probably OK. Not able to assess RV function due to poor visualization. Not able to assess RV size due to poor visualization.    Treatments:  See Above  Discharge Exam: Blood pressure 109/58, pulse 65, temperature 98.2 F (36.8 C), temperature source Oral, resp. rate 16, height 5\' 10"  (1.778 m), weight 167 lb 8.8 oz (76 kg), SpO2 97.00%.   Disposition: 01-Home or Self Care  Discharge Instructions   Amb Referral to Cardiac Rehabilitation    Complete by:  As directed             Medication List    STOP taking these medications       levofloxacin 750 MG tablet  Commonly known as:  LEVAQUIN      TAKE these medications       aspirin 81 MG EC tablet  Take 1 tablet (81 mg total) by mouth daily.     atorvastatin 20 MG tablet  Commonly known as:  LIPITOR  Take 1 tablet (20 mg total) by mouth daily at 6 PM.     finasteride 5 MG tablet  Commonly known as:  PROSCAR  Take 1 tablet (5 mg total) by mouth daily.     gabapentin 400 MG capsule  Commonly known as:  NEURONTIN  Take 1 capsule (400 mg total) by mouth at  bedtime.     HYDROcodone-acetaminophen 5-325 MG per tablet  Commonly known as:  NORCO/VICODIN  Take 1-2 tablets by mouth every 4 (four) hours as needed for moderate pain.     metoprolol tartrate 25 MG tablet  Commonly known as:  LOPRESSOR  Take 0.5 tablets (12.5 mg total) by mouth 2 (two) times daily.     nitroGLYCERIN 0.4 MG SL tablet  Commonly known as:  NITROSTAT  Place 1 tablet (0.4 mg total) under the tongue every 5 (five) minutes x 3 doses as needed for chest pain.     pantoprazole 40 MG tablet  Commonly known as:  PROTONIX  Take 1 tablet (40 mg total) by mouth daily at 6  (six) AM.     sertraline 25 MG tablet  Commonly known as:  ZOLOFT  Take 0.5 tablets (12.5 mg total) by mouth daily.     tamsulosin 0.4 MG Caps capsule  Commonly known as:  FLOMAX  Take 1 capsule (0.4 mg total) by mouth daily.           Follow-up Information   Follow up with Joni Reining, NP On 08/09/2013. (3:10PM)    Specialty:  Nurse Practitioner   Contact information:   [address] [place] [address] [phone]      Greater than 30 minutes was spent completing the patient's discharge.   SignedWilburt Finlay, [facility] 07/23/2013, 11:55 AM

## 2013-07-23 NOTE — Progress Notes (Signed)
Subjective: No CP  No SOB Objective: Filed Vitals:   07/22/13 1148 07/22/13 1416 07/22/13 2116 07/23/13 0504  BP: 101/50 98/51 109/49 109/58  Pulse: 72 70 73 65  Temp: 97.7 F (36.5 C) 98 F (36.7 C) 98.2 F (36.8 C) 98.2 F (36.8 C)  TempSrc: Oral Oral Oral Oral  Resp: 16 16 16 16   Height:      Weight:      SpO2: 95% 97% 95% 97%   Weight change:   Intake/Output Summary (Last 24 hours) at 07/23/13 0942 Last data filed at 07/23/13 4235  Gross per 24 hour  Intake    600 ml  Output   1625 ml  Net  -1025 ml    General: Alert, awake, oriented x3, in no acute distress Neck:  JVP is normal Heart: Regular rate and rhythm, without murmurs, rubs, gallops.  Lungs: Clear to auscultation.  No rales or wheezes. Exemities:  No edema.   Neuro: Grossly intact, nonfocal.  Te;e"  SR   Lab Results: Results for orders placed during the hospital encounter of 07/20/13 (from the past 24 hour(s))  GLUCOSE, CAPILLARY     Status: None   Collection Time    07/22/13 12:36 PM      Result Value Ref Range   Glucose-Capillary 98  70 - 99 mg/dL   Comment 1 Notify RN    GLUCOSE, CAPILLARY     Status: Abnormal   Collection Time    07/22/13  4:12 PM      Result Value Ref Range   Glucose-Capillary 125 (*) 70 - 99 mg/dL   Comment 1 Notify RN    GLUCOSE, CAPILLARY     Status: Abnormal   Collection Time    07/22/13  9:15 PM      Result Value Ref Range   Glucose-Capillary 145 (*) 70 - 99 mg/dL  CBC     Status: Abnormal   Collection Time    07/23/13  3:24 AM      Result Value Ref Range   WBC 5.3  4.0 - 10.5 K/uL   RBC 3.90 (*) 4.22 - 5.81 MIL/uL   Hemoglobin 11.7 (*) 13.0 - 17.0 g/dL   HCT 36.1 (*) 44.3 - 15.4 %   MCV 91.3  78.0 - 100.0 fL   MCH 30.0  26.0 - 34.0 pg   MCHC 32.9  30.0 - 36.0 g/dL   RDW 00.8  67.6 - 19.5 %   Platelets 140 (*) 150 - 400 K/uL  GLUCOSE, CAPILLARY     Status: Abnormal   Collection Time    07/23/13  6:31 AM      Result Value Ref Range   Glucose-Capillary 120  (*) 70 - 99 mg/dL    Studies/Results: No results found.  Medications: Reviewed   @PROBHOSP @  1  NSTEMI  Patient doing well  Plan for conservative Rx with age  Patinet did not want further testing.  Continue current regimen.  F/U with Dr Purvis Sheffield in [place]  2.  HL  Statin  3  Constipatiion  Patient request enema  LOS: 3 days   Pricilla Riffle 07/23/2013, 9:42 AM

## 2013-07-23 NOTE — Progress Notes (Signed)
CARDIAC REHAB PHASE I   PRE:  Rate/Rhythm: 73 SR  BP:  Supine: 94/50  Sitting:   Standing:   SaO2: 95 RA  MODE:  Ambulation: 510 ft   POST:  Rate/Rhythm: 76  BP:  Supine:   Sitting: 110/70  Standing:    SaO2: 95 RA 1040-1130 Assisted X 1 and used walker to ambulate. Gait steady with walker. Pt able to walk 510 feet without c/o of cp or SOB. Completed MI education with pt. He voices understanding. Pt is interested in attending Outpt. [facility] in [place], will send referral. He has a walker at home to use.  Melina Copa RN 07/23/2013 11:26 AM

## 2013-08-09 ENCOUNTER — Encounter: Payer: Medicare Other | Admitting: Adult Health

## 2013-08-09 NOTE — Progress Notes (Signed)
ERROR

## 2013-08-12 ENCOUNTER — Encounter: Payer: Self-pay | Admitting: *Deleted

## 2013-08-19 ENCOUNTER — Encounter: Payer: Self-pay | Admitting: Adult Health

## 2013-08-19 NOTE — Progress Notes (Signed)
HPI: [name] is a 80 year old patient of Dr. Beulah GandyKoneswarin where following for ongoing assessment and management of non-ST elevation MI during hospitalization in May 2015 in the setting of community-acquired pneumonia, with diabetes and dyslipidemia. During hospitalization the patient refused any cardiac catheterizations or stress testing, stating "I am too old for that". A 2-D echo was completed during hospitalization revealing an EF of 60% with no wall motion abnormalities. He was continued on aspirin beta blocker and statin.       The patient comes today with his niece and is without complaint. He has gained approximately 20 pounds since discharge. He states he eats lots this is gotten home, and neighbors have been bringing him by food. He is a diabetic, but states he has been eating cake and banana pudding. [facility], for diabetic management and labs.        No Known Allergies  Current Outpatient Prescriptions  Medication Sig Dispense Refill  . aspirin EC 81 MG EC tablet Take 1 tablet (81 mg total) by mouth daily.      Marland Kitchen. atorvastatin (LIPITOR) 20 MG tablet Take 1 tablet (20 mg total) by mouth daily at 6 PM.  30 tablet  5  . donepezil (ARICEPT) 10 MG tablet Take 10 mg by mouth at bedtime.      . finasteride (PROSCAR) 5 MG tablet Take 1 tablet (5 mg total) by mouth daily.      Marland Kitchen. gabapentin (NEURONTIN) 400 MG capsule Take 1 capsule (400 mg total) by mouth at bedtime.      Marland Kitchen. HYDROcodone-acetaminophen (NORCO/VICODIN) 5-325 MG per tablet Take 1-2 tablets by mouth every 4 (four) hours as needed for moderate pain.  30 tablet  0  . metoprolol tartrate (LOPRESSOR) 25 MG tablet Take 0.5 tablets (12.5 mg total) by mouth 2 (two) times daily.  60 tablet  5  . nitroGLYCERIN (NITROSTAT) 0.4 MG SL tablet Place 1 tablet (0.4 mg total) under the tongue every 5 (five) minutes x 3 doses as needed for chest pain.  25 tablet  12  . pantoprazole (PROTONIX) 40 MG tablet Take 1 tablet (40 mg  total) by mouth daily at 6 (six) AM.  30 tablet  5  . promethazine (PHENERGAN) 25 MG tablet Take 25 mg by mouth 2 (two) times daily.      . sertraline (ZOLOFT) 25 MG tablet Take 0.5 tablets (12.5 mg total) by mouth daily.      . tamsulosin (FLOMAX) 0.4 MG CAPS capsule Take 1 capsule (0.4 mg total) by mouth daily.  30 capsule     No current facility-administered medications for this visit.    Past Medical History  Diagnosis Date  . Diabetes mellitus without complication   . Anxiety   . BPH (benign prostatic hyperplasia)   . Myocardial infarction 07/2013    NSTEMI  . Chest tightness   . Arthritis     LEGS & HANDS    Past Surgical History  Procedure Laterality Date  . Cholecystectomy    . Lung surgery Left     ROS:  Review of systems complete and found to be negative unless listed above  PHYSICAL EXAM BP 120/62  Pulse 70  Ht 5\' 11"  (1.803 m)  Wt 187 lb (84.823 kg)  BMI 26.09 kg/m2  SpO2 96% General: Well developed, well nourished, in no acute distress Head: Eyes PERRLA, No xanthomas.   Normal cephalic and atramatic  Lungs: Clear bilaterally to auscultation and percussion.  Heart: HRRR S1 S2, without MRG.  Pulses are 2+ & equal.            No carotid bruit. No JVD.  No abdominal bruits. No femoral bruits. Abdomen: Bowel sounds are positive, abdomen soft and non-tender without masses or                  Hernia's noted. Msk:  Back normal, normal gait. Normal strength and tone for age. Extremities: No clubbing, cyanosis or edema.  DP +1 Neuro: Alert and oriented X 3. Psych:  Good affect, responds appropriately     ASSESSMENT AND PLAN

## 2013-08-20 ENCOUNTER — Ambulatory Visit (INDEPENDENT_AMBULATORY_CARE_PROVIDER_SITE_OTHER): Payer: Medicare Other | Admitting: Adult Health

## 2013-08-20 ENCOUNTER — Encounter: Payer: Self-pay | Admitting: Adult Health

## 2013-08-20 VITALS — BP 120/62 | HR 70 | Ht 71.0 in | Wt 187.0 lb

## 2013-08-20 DIAGNOSIS — E785 Hyperlipidemia, unspecified: Secondary | ICD-10-CM

## 2013-08-20 DIAGNOSIS — E119 Type 2 diabetes mellitus without complications: Secondary | ICD-10-CM

## 2013-08-20 DIAGNOSIS — I214 Non-ST elevation (NSTEMI) myocardial infarction: Secondary | ICD-10-CM

## 2013-08-20 NOTE — Assessment & Plan Note (Signed)
He states he is followed by the [facility] for this. He is not watching his diet at all. Previously when he wishes. I am uncertain what his blood glucose is running as he is not taking it at home. He is been advised on a diabetic healthy diet. With his age, I doubt that he will make significant lifestyle changes that we had a good discussion concerning it. He will see [place] again in 6 months Alyssa to

## 2013-08-20 NOTE — Patient Instructions (Addendum)
Your physician recommends that you schedule a follow-up appointment in: 6 months with Dr Koneswaran You will receive a reminder letter two months in advance reminding you to call and schedule your appointment. If you don't receive this letter, please contact our office.  Your physician recommends that you continue on your current medications as directed. Please refer to the Current Medication list given to you today.   

## 2013-08-20 NOTE — Assessment & Plan Note (Signed)
Continue statin therapy, we have discussed low cholesterol diet.

## 2013-08-20 NOTE — Assessment & Plan Note (Signed)
He is without recurrent complaints of chest pain. He wishes to be treated medically because he feels that he is still to undergo diagnostic testing to include cardiac catheterization or intervention. He will continue on metoprolol 25 mg twice a day, aspirin 81 mg a day, and atorvastatin 20 mg daily. I have advised him to watch his cholesterol intake as he has been eating a lot of foods provided for him by friends and neighbors he verbalizes understanding

## 2013-08-20 NOTE — Progress Notes (Deleted)
Name: Blake Hester    DOB: 05/30/1915  Age: 80 y.o.  MR#: [id number]       PCP:  PROVIDER NOT IN SYSTEM      Insurance: Payor: MEDICARE / Plan: MEDICARE PART A AND B / Product Type: *No Product type* /   CC:    Chief Complaint  Patient presents with  . Chest Pain    Non-ST elevation MI    VS Filed Vitals:   08/20/13 1327  BP: 120/62  Pulse: 70  Height: 5\' 11"  (1.803 m)  Weight: 187 lb (84.823 kg)  SpO2: 96%    Weights Current Weight  08/20/13 187 lb (84.823 kg)  07/22/13 167 lb 8.8 oz (76 kg)  07/01/13 174 lb 12.8 oz (79.289 kg)    Blood Pressure  BP Readings from Last 3 Encounters:  08/20/13 120/62  07/23/13 109/58  07/02/13 134/69     Admit date:  (Not on file) Last encounter with RMR:  Visit date not found   Allergy Review of patient's allergies indicates no known allergies.  Current Outpatient Prescriptions  Medication Sig Dispense Refill  . aspirin EC 81 MG EC tablet Take 1 tablet (81 mg total) by mouth daily.      Marland Kitchen. atorvastatin (LIPITOR) 20 MG tablet Take 1 tablet (20 mg total) by mouth daily at 6 PM.  30 tablet  5  . donepezil (ARICEPT) 10 MG tablet Take 10 mg by mouth at bedtime.      . finasteride (PROSCAR) 5 MG tablet Take 1 tablet (5 mg total) by mouth daily.      Marland Kitchen. gabapentin (NEURONTIN) 400 MG capsule Take 1 capsule (400 mg total) by mouth at bedtime.      Marland Kitchen. HYDROcodone-acetaminophen (NORCO/VICODIN) 5-325 MG per tablet Take 1-2 tablets by mouth every 4 (four) hours as needed for moderate pain.  30 tablet  0  . metoprolol tartrate (LOPRESSOR) 25 MG tablet Take 0.5 tablets (12.5 mg total) by mouth 2 (two) times daily.  60 tablet  5  . nitroGLYCERIN (NITROSTAT) 0.4 MG SL tablet Place 1 tablet (0.4 mg total) under the tongue every 5 (five) minutes x 3 doses as needed for chest pain.  25 tablet  12  . pantoprazole (PROTONIX) 40 MG tablet Take 1 tablet (40 mg total) by mouth daily at 6 (six) AM.  30 tablet  5  . promethazine (PHENERGAN) 25 MG tablet Take 25 mg  by mouth 2 (two) times daily.      . sertraline (ZOLOFT) 25 MG tablet Take 0.5 tablets (12.5 mg total) by mouth daily.      . tamsulosin (FLOMAX) 0.4 MG CAPS capsule Take 1 capsule (0.4 mg total) by mouth daily.  30 capsule     No current facility-administered medications for this visit.    Discontinued Meds:   There are no discontinued medications.  Patient Active Problem List   Diagnosis Date Noted  . Dyslipidemia- LDL 152 07/21/2013  . NSTEMI (non-ST elevated myocardial infarction) 07/20/2013  . CAP (community acquired pneumonia)-discharged 07/02/13 07/01/2013  . Nausea & vomiting 07/01/2013  . Diarrhea 07/01/2013  . Community acquired pneumonia 07/01/2013  . Diabetes mellitus without complication   . Anxiety     LABS    Component Value Date/Time   NA 140 07/21/2013 0210   NA 138 07/20/2013 0324   NA 143 07/02/2013 0508   K 4.4 07/21/2013 0210   K 4.2 07/20/2013 0324   K 3.7 07/02/2013 0508   CL 102 07/21/2013 0210  CL 97 07/20/2013 0324   CL 105 07/02/2013 0508   CO2 26 07/21/2013 0210   CO2 29 07/20/2013 0324   CO2 24 07/02/2013 0508   GLUCOSE 97 07/21/2013 0210   GLUCOSE 150* 07/20/2013 0324   GLUCOSE 121* 07/02/2013 0508   BUN 14 07/21/2013 0210   BUN 15 07/20/2013 0324   BUN 6 07/02/2013 0508   CREATININE 0.73 07/21/2013 0210   CREATININE 0.97 07/20/2013 0324   CREATININE 0.83 07/02/2013 0508   CALCIUM 9.2 07/21/2013 0210   CALCIUM 9.6 07/20/2013 0324   CALCIUM 8.9 07/02/2013 0508   GFRNONAA 76* 07/21/2013 0210   GFRNONAA 67* 07/20/2013 0324   GFRNONAA 72* 07/02/2013 0508   GFRAA 88* 07/21/2013 0210   GFRAA 78* 07/20/2013 0324   GFRAA 83* 07/02/2013 0508   CMP     Component Value Date/Time   NA 140 07/21/2013 0210   K 4.4 07/21/2013 0210   CL 102 07/21/2013 0210   CO2 26 07/21/2013 0210   GLUCOSE 97 07/21/2013 0210   BUN 14 07/21/2013 0210   CREATININE 0.73 07/21/2013 0210   CALCIUM 9.2 07/21/2013 0210   PROT 7.4 07/20/2013 0324   ALBUMIN 3.7 07/20/2013 0324   AST 23 07/20/2013  0324   ALT 11 07/20/2013 0324   ALKPHOS 67 07/20/2013 0324   BILITOT 0.4 07/20/2013 0324   GFRNONAA 76* 07/21/2013 0210   GFRAA 88* 07/21/2013 0210       Component Value Date/Time   WBC 5.3 07/23/2013 0324   WBC 7.1 07/22/2013 0344   WBC 6.4 07/20/2013 0324   HGB 11.7* 07/23/2013 0324   HGB 12.3* 07/22/2013 0344   HGB 13.0 07/20/2013 0324   HCT 35.6* 07/23/2013 0324   HCT 37.1* 07/22/2013 0344   HCT 38.6* 07/20/2013 0324   MCV 91.3 07/23/2013 0324   MCV 90.3 07/22/2013 0344   MCV 89.8 07/20/2013 0324    Lipid Panel     Component Value Date/Time   CHOL 249* 07/21/2013 0210   TRIG 191* 07/21/2013 0210   HDL 59 07/21/2013 0210   CHOLHDL 4.2 07/21/2013 0210   VLDL 38 07/21/2013 0210   LDLCALC 152* 07/21/2013 0210    ABG    Component Value Date/Time   TCO2 28 03/17/2010 0854     No results found for this basename: TSH   BNP (last 3 results) No results found for this basename: PROBNP,  in the last 8760 hours Cardiac Panel (last 3 results) No results found for this basename: CKTOTAL, CKMB, TROPONINI, RELINDX,  in the last 72 hours  Iron/TIBC/Ferritin No results found for this basename: iron, tibc, ferritin     EKG Orders placed during the hospital encounter of 07/20/13  . EKG 12-LEAD  . EKG 12-LEAD  . EKG 12-LEAD  . EKG 12-LEAD  . EKG 12-LEAD     Prior Assessment and Plan Problem List as of 08/20/2013     Cardiovascular and Mediastinum   NSTEMI (non-ST elevated myocardial infarction)     Respiratory   CAP (community acquired pneumonia)-discharged 07/02/13   Community acquired pneumonia     Digestive   Nausea & vomiting     Endocrine   Diabetes mellitus without complication     Other   Diarrhea   Anxiety   Dyslipidemia- LDL 152       Imaging: No results found.

## 2013-10-06 ENCOUNTER — Emergency Department (HOSPITAL_COMMUNITY)
Admission: EM | Admit: 2013-10-06 | Discharge: 2013-10-07 | Disposition: A | Discharge status: Home / Self Care | Payer: Medicare Other | Attending: Emergency Medicine | Admitting: Emergency Medicine

## 2013-10-06 DIAGNOSIS — M259 Joint disorder, unspecified: Secondary | ICD-10-CM | POA: Diagnosis not present

## 2013-10-06 DIAGNOSIS — N400 Enlarged prostate without lower urinary tract symptoms: Secondary | ICD-10-CM | POA: Diagnosis not present

## 2013-10-06 DIAGNOSIS — Z79899 Other long term (current) drug therapy: Secondary | ICD-10-CM | POA: Insufficient documentation

## 2013-10-06 DIAGNOSIS — F411 Generalized anxiety disorder: Secondary | ICD-10-CM | POA: Insufficient documentation

## 2013-10-06 DIAGNOSIS — Z7982 Long term (current) use of aspirin: Secondary | ICD-10-CM | POA: Diagnosis not present

## 2013-10-06 DIAGNOSIS — I252 Old myocardial infarction: Secondary | ICD-10-CM | POA: Diagnosis not present

## 2013-10-06 DIAGNOSIS — E119 Type 2 diabetes mellitus without complications: Secondary | ICD-10-CM | POA: Insufficient documentation

## 2013-10-06 DIAGNOSIS — R112 Nausea with vomiting, unspecified: Secondary | ICD-10-CM

## 2013-10-06 DIAGNOSIS — E876 Hypokalemia: Secondary | ICD-10-CM

## 2013-10-07 ENCOUNTER — Emergency Department (HOSPITAL_COMMUNITY): Payer: Medicare Other

## 2013-10-07 ENCOUNTER — Encounter (HOSPITAL_COMMUNITY): Payer: Self-pay | Admitting: Emergency Medicine

## 2013-10-07 LAB — URINALYSIS, ROUTINE W REFLEX MICROSCOPIC
BILIRUBIN URINE: NEGATIVE
Glucose, UA: 250 mg/dL — AB
KETONES UR: NEGATIVE mg/dL
Leukocytes, UA: NEGATIVE
Nitrite: NEGATIVE
Protein, ur: 100 mg/dL — AB
SPECIFIC GRAVITY, URINE: 1.02 (ref 1.005–1.030)
UROBILINOGEN UA: 0.2 mg/dL (ref 0.0–1.0)
pH: 7.5 (ref 5.0–8.0)

## 2013-10-07 LAB — COMPREHENSIVE METABOLIC PANEL
ALBUMIN: 4.3 g/dL (ref 3.5–5.2)
ALK PHOS: 68 U/L (ref 39–117)
ALT: 15 U/L (ref 0–53)
ANION GAP: 14 (ref 5–15)
AST: 25 U/L (ref 0–37)
BUN: 13 mg/dL (ref 6–23)
CO2: 28 mEq/L (ref 19–32)
CREATININE: 0.87 mg/dL (ref 0.50–1.35)
Calcium: 9.2 mg/dL (ref 8.4–10.5)
Chloride: 99 mEq/L (ref 96–112)
GFR calc non Af Amer: 71 mL/min — ABNORMAL LOW (ref >90)
GFR, EST AFRICAN AMERICAN: 82 mL/min — AB (ref >90)
GLUCOSE: 202 mg/dL — AB (ref 70–99)
POTASSIUM: 3.1 meq/L — AB (ref 3.7–5.3)
Sodium: 141 mEq/L (ref 137–147)
TOTAL PROTEIN: 8.1 g/dL (ref 6.0–8.3)
Total Bilirubin: 0.5 mg/dL (ref 0.3–1.2)

## 2013-10-07 LAB — CBG MONITORING, ED: GLUCOSE-CAPILLARY: 179 mg/dL — AB (ref 70–99)

## 2013-10-07 LAB — URINE MICROSCOPIC-ADD ON

## 2013-10-07 LAB — CBC WITH DIFFERENTIAL/PLATELET
Basophils Absolute: 0 10*3/uL (ref 0.0–0.1)
Basophils Relative: 0 % (ref 0–1)
EOS ABS: 0.2 10*3/uL (ref 0.0–0.7)
Eosinophils Relative: 2 % (ref 0–5)
HCT: 37.5 % — ABNORMAL LOW (ref 39.0–52.0)
HEMOGLOBIN: 13 g/dL (ref 13.0–17.0)
Lymphocytes Relative: 22 % (ref 12–46)
Lymphs Abs: 2 10*3/uL (ref 0.7–4.0)
MCH: 30.9 pg (ref 26.0–34.0)
MCHC: 34.7 g/dL (ref 30.0–36.0)
MCV: 89.1 fL (ref 78.0–100.0)
MONOS PCT: 5 % (ref 3–12)
Monocytes Absolute: 0.5 10*3/uL (ref 0.1–1.0)
NEUTROS PCT: 71 % (ref 43–77)
Neutro Abs: 6.4 10*3/uL (ref 1.7–7.7)
Platelets: 146 10*3/uL — ABNORMAL LOW (ref 150–400)
RBC: 4.21 MIL/uL — ABNORMAL LOW (ref 4.22–5.81)
RDW: 13 % (ref 11.5–15.5)
WBC: 9.1 10*3/uL (ref 4.0–10.5)

## 2013-10-07 LAB — TROPONIN I: Troponin I: <0.3 ng/mL (ref <0.30)

## 2013-10-07 LAB — LACTIC ACID, PLASMA: LACTIC ACID, VENOUS: 1.7 mmol/L (ref 0.5–2.2)

## 2013-10-07 LAB — LIPASE, BLOOD: Lipase: 24 U/L (ref 11–59)

## 2013-10-07 MED ORDER — ONDANSETRON HCL 4 MG/2ML IJ SOLN
4.0000 mg | Freq: Once | INTRAMUSCULAR | Status: AC
  Administered 2013-10-07: 4 mg via INTRAVENOUS
  Filled 2013-10-07: qty 2

## 2013-10-07 MED ORDER — POTASSIUM CHLORIDE CRYS ER 20 MEQ PO TBCR
40.0000 meq | EXTENDED_RELEASE_TABLET | Freq: Once | ORAL | Status: AC
  Administered 2013-10-07: 40 meq via ORAL
  Filled 2013-10-07: qty 2

## 2013-10-07 NOTE — Discharge Instructions (Signed)
Hypokalemia °Hypokalemia means that the amount of potassium in the blood is lower than normal. Potassium is a chemical, called an electrolyte, that helps regulate the amount of fluid in the body. It also stimulates muscle contraction and helps nerves function properly. Most of the body's potassium is inside of cells, and only a very small amount is in the blood. Because the amount in the blood is so small, minor changes can be life-threatening. °CAUSES °· Antibiotics. °· Diarrhea or vomiting. °· Using laxatives too much, which can cause diarrhea. °· Chronic kidney disease. °· Water pills (diuretics). °· Eating disorders (bulimia). °· Low magnesium level. °· Sweating a lot. °SIGNS AND SYMPTOMS °· Weakness. °· Constipation. °· Fatigue. °· Muscle cramps. °· Mental confusion. °· Skipped heartbeats or irregular heartbeat (palpitations). °· Tingling or numbness. °DIAGNOSIS  °Your health care provider can diagnose hypokalemia with blood tests. In addition to checking your potassium level, your health care provider may also check other lab tests. °TREATMENT °Hypokalemia can be treated with potassium supplements taken by mouth or adjustments in your current medicines. If your potassium level is very low, you may need to get potassium through a vein (IV) and be monitored in the hospital. A diet high in potassium is also helpful. Foods high in potassium are: °· Nuts, such as peanuts and pistachios. °· Seeds, such as sunflower seeds and pumpkin seeds. °· Peas, lentils, and lima beans. °· Whole grain and bran cereals and breads. °· Fresh fruit and vegetables, such as apricots, avocado, bananas, cantaloupe, kiwi, oranges, tomatoes, asparagus, and potatoes. °· Orange and tomato juices. °· Red meats. °· Fruit yogurt. °HOME CARE INSTRUCTIONS °· Take all medicines as prescribed by your health care provider. °· Maintain a healthy diet by including nutritious food, such as fruits, vegetables, nuts, whole grains, and lean meats. °· If  you are taking a laxative, be sure to follow the directions on the label. °SEEK MEDICAL CARE IF: °· Your weakness gets worse. °· You feel your heart pounding or racing. °· You are vomiting or having diarrhea. °· You are diabetic and having trouble keeping your blood glucose in the normal range. °SEEK IMMEDIATE MEDICAL CARE IF: °· You have chest pain, shortness of breath, or dizziness. °· You are vomiting or having diarrhea for more than 2 days. °· You faint. °MAKE SURE YOU:  °· Understand these instructions. °· Will watch your condition. °· Will get help right away if you are not doing well or get worse. °Document Released: 02/25/2005 Document Revised: 12/16/2012 Document Reviewed: 08/28/2012 °[employer]® Patient Information ©2015 [employer]. This information is not intended to replace advice given to you by your health care provider. Make sure you discuss any questions you have with your health care provider. °Nausea and Vomiting °Nausea is a sick feeling that often comes before throwing up (vomiting). Vomiting is a reflex where stomach contents come out of your mouth. Vomiting can cause severe loss of body fluids (dehydration). Children and elderly adults can become dehydrated quickly, especially if they also have diarrhea. Nausea and vomiting are symptoms of a condition or disease. It is important to find the cause of your symptoms. °CAUSES  °· Direct irritation of the stomach lining. This irritation can result from increased acid production (gastroesophageal reflux disease), infection, food poisoning, taking certain medicines (such as nonsteroidal anti-inflammatory drugs), alcohol use, or tobacco use. °· Signals from the brain. These signals could be caused by a headache, heat exposure, an inner ear disturbance, increased pressure in the brain from injury, infection, a tumor,   or a concussion, pain, emotional stimulus, or metabolic problems. °· An obstruction in the gastrointestinal tract (bowel  obstruction). °· Illnesses such as diabetes, hepatitis, gallbladder problems, appendicitis, kidney problems, cancer, sepsis, atypical symptoms of a heart attack, or eating disorders. °· Medical treatments such as chemotherapy and radiation. °· Receiving medicine that makes you sleep (general anesthetic) during surgery. °DIAGNOSIS °Your caregiver may ask for tests to be done if the problems do not improve after a few days. Tests may also be done if symptoms are severe or if the reason for the nausea and vomiting is not clear. Tests may include: °· Urine tests. °· Blood tests. °· Stool tests. °· Cultures (to look for evidence of infection). °· X-rays or other imaging studies. °Test results can help your caregiver make decisions about treatment or the need for additional tests. °TREATMENT °You need to stay well hydrated. Drink frequently but in small amounts. You may wish to drink water, sports drinks, clear broth, or eat frozen ice pops or gelatin dessert to help stay hydrated. When you eat, eating slowly may help prevent nausea. There are also some antinausea medicines that may help prevent nausea. °HOME CARE INSTRUCTIONS  °· Take all medicine as directed by your caregiver. °· If you do not have an appetite, do not force yourself to eat. However, you must continue to drink fluids. °· If you have an appetite, eat a normal diet unless your caregiver tells you differently. °· Eat a variety of complex carbohydrates (rice, wheat, potatoes, bread), lean meats, yogurt, fruits, and vegetables. °· Avoid high-fat foods because they are more difficult to digest. °· Drink enough water and fluids to keep your urine clear or pale yellow. °· If you are dehydrated, ask your caregiver for specific rehydration instructions. Signs of dehydration may include: °· Severe thirst. °· Dry lips and mouth. °· Dizziness. °· Dark urine. °· Decreasing urine frequency and amount. °· Confusion. °· Rapid breathing or pulse. °SEEK IMMEDIATE MEDICAL  CARE IF:  °· You have blood or brown flecks (like coffee grounds) in your vomit. °· You have black or bloody stools. °· You have a severe headache or stiff neck. °· You are confused. °· You have severe abdominal pain. °· You have chest pain or trouble breathing. °· You do not urinate at least once every 8 hours. °· You develop cold or clammy skin. °· You continue to vomit for longer than 24 to 48 hours. °· You have a fever. °MAKE SURE YOU:  °· Understand these instructions. °· Will watch your condition. °· Will get help right away if you are not doing well or get worse. °Document Released: 02/25/2005 Document Revised: 05/20/2011 Document Reviewed: 07/25/2010 °[employer]® Patient Information ©2015 [employer]. This information is not intended to replace advice given to you by your health care provider. Make sure you discuss any questions you have with your health care provider. ° °

## 2013-10-07 NOTE — ED Provider Notes (Signed)
TIME SEEN: 12:17 AM  CHIEF COMPLAINT: Nausea, vomiting  HPI: Patient is a 80 year old male with history of non-insulin-dependent diabetes, recent NSTEMI in May 2015 who presents the emergency department with complaints of nausea and vomiting that started today around 8 PM. He denies any headache, head injury, chest pain or shortness of breath, abdominal pain, diarrhea, fevers or chills, cough, dysuria or hematuria. He is status post cholecystectomy. No other abdominal surgery. He states his symptoms started after taking stool softeners. He states he has an issue with chronic constipation. Last bowel movement was on Monday, 2 days ago. He states he has been passing gas. No abdominal distention. No sick contacts or recent travel.  ROS: See HPI Constitutional: no fever  Eyes: no drainage  ENT: no runny nose   Cardiovascular:  no chest pain  Resp: no SOB  GI:  vomiting GU: no dysuria Integumentary: no rash  Allergy: no hives  Musculoskeletal: no leg swelling  Neurological: no slurred speech ROS otherwise negative  PAST MEDICAL HISTORY/PAST SURGICAL HISTORY:  Past Medical History  Diagnosis Date  . Diabetes mellitus without complication   . Anxiety   . BPH (benign prostatic hyperplasia)   . Myocardial infarction 07/2013    NSTEMI  . Chest tightness   . Arthritis     LEGS & HANDS    MEDICATIONS:  Prior to Admission medications   Medication Sig Start Date End Date Taking? Authorizing Provider  aspirin EC 81 MG EC tablet Take 1 tablet (81 mg total) by mouth daily. 07/23/13   Wilburt Finlay, PA-C  atorvastatin (LIPITOR) 20 MG tablet Take 1 tablet (20 mg total) by mouth daily at 6 PM. 07/23/13   Wilburt Finlay, PA-C  donepezil (ARICEPT) 10 MG tablet Take 10 mg by mouth at bedtime.    Historical Provider, MD  finasteride (PROSCAR) 5 MG tablet Take 1 tablet (5 mg total) by mouth daily. 07/02/13   Gwenyth Bender, NP  gabapentin (NEURONTIN) 400 MG capsule Take 1 capsule (400 mg total) by mouth at  bedtime. 07/02/13   Gwenyth Bender, NP  HYDROcodone-acetaminophen (NORCO/VICODIN) 5-325 MG per tablet Take 1-2 tablets by mouth every 4 (four) hours as needed for moderate pain. 07/02/13   Gwenyth Bender, NP  metoprolol tartrate (LOPRESSOR) 25 MG tablet Take 0.5 tablets (12.5 mg total) by mouth 2 (two) times daily. 07/23/13   Wilburt Finlay, PA-C  nitroGLYCERIN (NITROSTAT) 0.4 MG SL tablet Place 1 tablet (0.4 mg total) under the tongue every 5 (five) minutes x 3 doses as needed for chest pain. 07/23/13   Wilburt Finlay, PA-C  pantoprazole (PROTONIX) 40 MG tablet Take 1 tablet (40 mg total) by mouth daily at 6 (six) AM. 07/23/13   Wilburt Finlay, PA-C  promethazine (PHENERGAN) 25 MG tablet Take 25 mg by mouth 2 (two) times daily.    Historical Provider, MD  sertraline (ZOLOFT) 25 MG tablet Take 0.5 tablets (12.5 mg total) by mouth daily. 07/02/13   Gwenyth Bender, NP  tamsulosin (FLOMAX) 0.4 MG CAPS capsule Take 1 capsule (0.4 mg total) by mouth daily. 07/02/13   Gwenyth Bender, NP    ALLERGIES:  No Known Allergies  SOCIAL HISTORY:  History  Substance Use Topics  . Smoking status: Never Smoker   . Smokeless tobacco: Never Used  . Alcohol Use: No    FAMILY HISTORY: Family History  Problem Relation Age of Onset  . Tuberculosis Mother   . Tuberculosis Father     EXAM: BP 136/77  Pulse 71  Resp 16  Ht 6\' 2"  (1.88 m)  Wt 175 lb (79.379 kg)  BMI 22.46 kg/m2  SpO2 97% CONSTITUTIONAL: Alert and oriented and responds appropriately to questions. Well-appearing; well-nourished HEAD: Normocephalic EYES: Conjunctivae clear, PERRL ENT: normal nose; no rhinorrhea; moist mucous membranes; pharynx without lesions noted NECK: Supple, no meningismus, no LAD  CARD: RRR; S1 and S2 appreciated; no murmurs, no clicks, no rubs, no gallops RESP: Normal chest excursion without splinting or tachypnea; breath sounds clear and equal bilaterally; no wheezes, no rhonchi, no rales,  ABD/GI: Normal bowel sounds; non-distended;  soft, non-tender, no rebound, no guarding, no peritoneal signs, nontympanitic and no fluid wave BACK:  The back appears normal and is non-tender to palpation, there is no CVA tenderness EXT: Normal ROM in all joints; non-tender to palpation; no edema; normal capillary refill; no cyanosis    SKIN: Normal color for age and race; cool and clammy NEURO: Moves all extremities equally PSYCH: The patient's mood and manner are appropriate. Grooming and personal hygiene are appropriate.  MEDICAL DECISION MAKING: Patient here with vomiting that started at 8 PM. Differential diagnosis includes ACS, viral illness, constipation or bowel obstruction, UTI. Will obtain labs, urine and acute abdominal series. We'll give IV fluids and Zofran. EKG shows improvement in lateral T wave changes that were seen in May 2015. During his admission for NSTEMI patient opted for medical management, he did not have a cardiac catheterization.  ED PROGRESS: Patient's labs are unremarkable other than slightly potassium of 3.1. Will replace. Troponin negative. Lactate normal. Urine shows small hemoglobin but no other sign of infection. X-ray shows a nonobstructive bowel gas pattern and no free air. He is feeling better and able to tolerate by mouth. Will recheck troponin, scissors hours after the onset of symptoms in nature this is not ACS. If negative, the patient can be safely discharged home. He agrees with this plan.  2:54 AM  Pt's second troponin is negative. He is still not having any further vomiting and feels better. I feel he is safe to be discharged home with his son. Discussed return precautions and supportive care instructions. They verbalize understanding and are comfortable with plan.   Date: 10/07/2013 00:04  Rate: 68  Rhythm: normal sinus rhythm  QRS Axis: normal  Intervals: normal  ST/T Wave abnormalities: normal  Conduction Disutrbances: none  Narrative Interpretation: Prolonged QT interval, T wave inversions in  lateral leads have improved compared to prior EKG in May 2015      Kristen N Ward, DO 10/07/13 [phone]

## 2013-10-07 NOTE — ED Notes (Addendum)
Patient from home. States history of constipation. Was attempting to have bowel movement when patient possibly experienced vagal response. Patient was diaphoretic on EMS arrival and vomiting. EMS administered 4 mg of zofran via IV and 500 mg NS bolus. Patient denies pain or nausea at this time. Reports still feels generally weak and dizzy.

## 2014-02-10 ENCOUNTER — Other Ambulatory Visit: Payer: Self-pay

## 2014-02-10 MED ORDER — ATORVASTATIN CALCIUM 20 MG PO TABS: 20.0000 mg | ORAL_TABLET | Freq: Every day | ORAL | Status: DC

## 2014-02-10 NOTE — Telephone Encounter (Signed)
Rx sent to pharmacy   

## 2014-02-15 ENCOUNTER — Other Ambulatory Visit: Payer: Self-pay

## 2014-02-15 MED ORDER — ATORVASTATIN CALCIUM 20 MG PO TABS: 20.0000 mg | ORAL_TABLET | Freq: Every day | ORAL | Status: AC

## 2014-03-05 ENCOUNTER — Emergency Department (HOSPITAL_COMMUNITY)
Admission: EM | Admit: 2014-03-05 | Discharge: 2014-03-05 | Disposition: A | Discharge status: Home / Self Care | Payer: Non-veteran care | Attending: Emergency Medicine | Admitting: Emergency Medicine

## 2014-03-05 ENCOUNTER — Emergency Department (HOSPITAL_COMMUNITY): Payer: Non-veteran care

## 2014-03-05 ENCOUNTER — Encounter (HOSPITAL_COMMUNITY): Payer: Self-pay | Admitting: Emergency Medicine

## 2014-03-05 DIAGNOSIS — E119 Type 2 diabetes mellitus without complications: Secondary | ICD-10-CM | POA: Diagnosis not present

## 2014-03-05 DIAGNOSIS — R52 Pain, unspecified: Secondary | ICD-10-CM

## 2014-03-05 DIAGNOSIS — H578 Other specified disorders of eye and adnexa: Secondary | ICD-10-CM | POA: Insufficient documentation

## 2014-03-05 DIAGNOSIS — Z7982 Long term (current) use of aspirin: Secondary | ICD-10-CM | POA: Insufficient documentation

## 2014-03-05 DIAGNOSIS — Z79899 Other long term (current) drug therapy: Secondary | ICD-10-CM | POA: Insufficient documentation

## 2014-03-05 DIAGNOSIS — R079 Chest pain, unspecified: Secondary | ICD-10-CM | POA: Insufficient documentation

## 2014-03-05 DIAGNOSIS — R0989 Other specified symptoms and signs involving the circulatory and respiratory systems: Secondary | ICD-10-CM | POA: Insufficient documentation

## 2014-03-05 DIAGNOSIS — M159 Polyosteoarthritis, unspecified: Secondary | ICD-10-CM | POA: Diagnosis not present

## 2014-03-05 DIAGNOSIS — R630 Anorexia: Secondary | ICD-10-CM | POA: Insufficient documentation

## 2014-03-05 DIAGNOSIS — I252 Old myocardial infarction: Secondary | ICD-10-CM | POA: Insufficient documentation

## 2014-03-05 DIAGNOSIS — F419 Anxiety disorder, unspecified: Secondary | ICD-10-CM | POA: Diagnosis not present

## 2014-03-05 DIAGNOSIS — G4489 Other headache syndrome: Secondary | ICD-10-CM | POA: Insufficient documentation

## 2014-03-05 DIAGNOSIS — R05 Cough: Secondary | ICD-10-CM | POA: Insufficient documentation

## 2014-03-05 DIAGNOSIS — N400 Enlarged prostate without lower urinary tract symptoms: Secondary | ICD-10-CM | POA: Insufficient documentation

## 2014-03-05 DIAGNOSIS — H5789 Other specified disorders of eye and adnexa: Secondary | ICD-10-CM

## 2014-03-05 DIAGNOSIS — R51 Headache: Secondary | ICD-10-CM | POA: Diagnosis present

## 2014-03-05 MED ORDER — ACETAMINOPHEN 325 MG PO TABS
650.0000 mg | ORAL_TABLET | Freq: Once | ORAL | Status: AC
  Administered 2014-03-05: 650 mg via ORAL
  Filled 2014-03-05: qty 2

## 2014-03-05 MED ORDER — LORATADINE 10 MG PO TABS
10.0000 mg | ORAL_TABLET | Freq: Once | ORAL | Status: AC
  Administered 2014-03-05: 10 mg via ORAL
  Filled 2014-03-05: qty 1

## 2014-03-05 NOTE — ED Provider Notes (Signed)
CSN: [id number]     Arrival date & time 03/05/14  1044 History  This chart was scribed for Blake Hester Apt, MD by Ronney LionSuzanne Le, ED Scribe. This patient was seen in room APA05/[facility]05 and the patient's care was started at 12:56 PM.    Chief Complaint  Patient presents with  . Headache   The history is provided by the patient and a relative. No language interpreter was used.   HPI Comments: Charlies SilversCecil Hester is a 80 y.o. male who presents to the Emergency Department complaining of an intermittent, throbbing headache. He complains of associated nausea and loss of appetite. He endorses congestion, coughing, some chest pain, and occasional dizziness. His niece reports that his eyes, once extremely red, have cleared up. Pressing on his temples alleviates his pain. Patient has taken blood pressure medication and Advil. Dr. Reed Pandyamsey in [place] is his PCP.    Past Medical History  Diagnosis Date  . Diabetes mellitus without complication   . Anxiety   . BPH (benign prostatic hyperplasia)   . Myocardial infarction 07/2013    NSTEMI  . Chest tightness   . Arthritis     LEGS & HANDS   Past Surgical History  Procedure Laterality Date  . Cholecystectomy    . Lung surgery Left    Family History  Problem Relation Age of Onset  . Tuberculosis Mother   . Tuberculosis Father    History  Substance Use Topics  . Smoking status: Never Smoker   . Smokeless tobacco: Never Used  . Alcohol Use: No    Review of Systems  Constitutional: Positive for appetite change.  HENT: Positive for congestion.   Respiratory: Positive for cough.   Cardiovascular: Positive for chest pain.  Gastrointestinal: Positive for nausea.  Neurological: Positive for dizziness and headaches.  All other systems reviewed and are negative.     Allergies  Review of patient's allergies indicates no known allergies.  Home Medications   Prior to Admission medications   Medication Sig Start Date End Date Taking? Authorizing Provider   aspirin EC 81 MG EC tablet Take 1 tablet (81 mg total) by mouth daily. 07/23/13  Yes Dwana MelenaBryan W Hager, PA-C  atorvastatin (LIPITOR) 20 MG tablet Take 1 tablet (20 mg total) by mouth daily at 6 PM. 02/15/14  Yes Jodelle GrossKathryn M Lawrence, NP  donepezil (ARICEPT) 10 MG tablet Take 10 mg by mouth at bedtime.   Yes Historical Provider, MD  finasteride (PROSCAR) 5 MG tablet Take 1 tablet (5 mg total) by mouth daily. 07/02/13  Yes Lesle ChrisKaren M Black, NP  gabapentin (NEURONTIN) 400 MG capsule Take 1 capsule (400 mg total) by mouth at bedtime. 07/02/13  Yes Gwenyth BenderKaren M Black, NP  HYDROcodone-acetaminophen (NORCO/VICODIN) 5-325 MG per tablet Take 1-2 tablets by mouth every 4 (four) hours as needed for moderate pain. 07/02/13  Yes Lesle ChrisKaren M Black, NP  metoprolol tartrate (LOPRESSOR) 25 MG tablet Take 0.5 tablets (12.5 mg total) by mouth 2 (two) times daily. 07/23/13  Yes Dwana MelenaBryan W Hager, PA-C  nitroGLYCERIN (NITROSTAT) 0.4 MG SL tablet Place 1 tablet (0.4 mg total) under the tongue every 5 (five) minutes x 3 doses as needed for chest pain. 07/23/13  Yes Dwana MelenaBryan W Hager, PA-C  PRESCRIPTION MEDICATION Take 1 tablet by mouth 2 (two) times daily. Diabetic medicine.  Large white pill.   Yes Historical Provider, MD  sertraline (ZOLOFT) 25 MG tablet Take 0.5 tablets (12.5 mg total) by mouth daily. 07/02/13  Yes Gwenyth BenderKaren M Black, NP  tamsulosin (  FLOMAX) 0.4 MG CAPS capsule Take 1 capsule (0.4 mg total) by mouth daily. 07/02/13  Yes Lesle Chris Black, NP  pantoprazole (PROTONIX) 40 MG tablet Take 1 tablet (40 mg total) by mouth daily at 6 (six) AM. Patient not taking: Reported on 03/05/2014 07/23/13   Kelle Darting Hager, PA-C   BP 147/61 mmHg  Pulse 71  Temp(Src) 98.2 F (36.8 C) (Oral)  Resp 16  Ht 5\' 11"  (1.803 m)  Wt 172 lb (78.019 kg)  BMI 24.00 kg/m2  SpO2 97% Physical Exam  Constitutional: He is oriented to person, place, and time. He appears well-developed and well-nourished.  HENT:  Head: Normocephalic and atraumatic.  Right Ear: External ear  normal.  Left Ear: External ear normal.  Eyes: EOM are normal. Pupils are equal, round, and reactive to light. Right eye exhibits no chemosis and no discharge. Left eye exhibits no chemosis and no discharge. Right conjunctiva is injected. Left conjunctiva is injected.  Mild conjunctival injection. No drainage, no chemosis.  Neck: Normal range of motion and phonation normal. Neck supple.  Cardiovascular: Normal rate, regular rhythm and normal heart sounds.   Pulmonary/Chest: Effort normal and breath sounds normal. He exhibits no bony tenderness.  Abdominal: Soft. There is no tenderness.  Musculoskeletal: Normal range of motion. He exhibits tenderness.  Mild left paravertebral tenderness.  Neurological: He is alert and oriented to person, place, and time. No cranial nerve deficit or sensory deficit. He exhibits normal muscle tone. Coordination normal.  Skin: Skin is warm, dry and intact.  Psychiatric: He has a normal mood and affect. His behavior is normal. Judgment and thought content normal.  Nursing note and vitals reviewed.   ED Course  Procedures (including critical care time)  DIAGNOSTIC STUDIES: Oxygen Saturation is 97% on room air, normal by my interpretation.    COORDINATION OF CARE: 1:01 PM - Discussed treatment plan with pt at bedside which includes head CT and pt agreed to plan.   Medications  loratadine (CLARITIN) tablet 10 mg (10 mg Oral Given 03/05/14 1313)  acetaminophen (TYLENOL) tablet 650 mg (650 mg Oral Given 03/05/14 1313)    Patient Vitals for the past 24 hrs:  BP Temp Temp src Pulse Resp SpO2 Height Weight  03/05/14 1101 147/61 mmHg 98.2 F (36.8 C) Oral 71 16 97 % 5\' 11"  (1.803 m) 172 lb (78.019 kg)  03/05/14 1100 147/61 mmHg - - - - - - -    3:42 PM Reevaluation with update and discussion. After initial assessment and treatment, an updated evaluation reveals clinical status is unchanged.  Normal TMJ function bilaterally.  No palpable deformity or mass of  the right mandible.  No submandibular swelling or adenopathy.  Findings discussed with patient, and family members who are with him.  We discussed the CT abnormality, and the importance of following up on it.  All questions answered. Candies Palm L    Medications  loratadine (CLARITIN) tablet 10 mg (10 mg Oral Given 03/05/14 1313)  acetaminophen (TYLENOL) tablet 650 mg (650 mg Oral Given 03/05/14 1313)     Imaging Review Ct Head Wo Contrast  03/05/2014   CLINICAL DATA:  Pain on both sides of the head. Pain in the temporal areas.  EXAM: CT HEAD WITHOUT CONTRAST  CT MAXILLOFACIAL/SINUS WITHOUT CONTRAST  TECHNIQUE: Multidetector CT imaging of the head and maxillofacial structures were performed using the standard protocol without intravenous contrast. Multiplanar CT image reconstructions of the maxillofacial structures were also generated. The study was ordered as a CT of  the sinuses and the maxillofacial images were obtained through the sinuses. The mandible was not completely imaged.  COMPARISON:  Head CT 07/30/2008  FINDINGS: CT HEAD FINDINGS  There is stable mild cerebral atrophy which appears to be age appropriate. There is stable low-density in the right parietal subcortical white matter. No evidence for acute hemorrhage, mass lesion, midline shift, hydrocephalus or large infarct. Visualized mastoid air cells and paranasal sinuses are clear. No acute bone abnormality.  CT MAXILLOFACIAL FINDINGS  Images through the face and sinuses were obtained. There is mild mucosal disease in the right maxillary sinus. Otherwise, the paranasal sinuses are clear. No significant fluid in the sinuses. There is chronic deformity and depression involving the medial right orbital wall consistent with an old fracture. Mastoid air cells are well aerated. There is a stable area of lucency involving the left frontal bone on sequence 4, image 1.  There is deformity and irregularity of the mandibular condyles bilaterally.  There may be subluxation involving the left TMJ. These TMJ findings are similar to 2010. However, there is sclerosis and cortical thickening involving the the right mandible in the ramus region. This area is incompletely imaged. However, this appears to be a new finding. Pterygoid plates are intact. The globes are intact. Again noted is a small nodule or lymph node in the anterior right parotid gland and this is likely an incidental finding.  IMPRESSION: No acute intracranial abnormality.  Stable atrophy and old white matter disease in the right parietal lobe.  There is mild mucosal disease in the right maxillary sinus. Otherwise, the paranasal sinuses are clear.  There is chronic irregularity and deformity of the mandibular condyles and this could be contributing to bilateral temporal pain. Recommend clinical correlation. In addition, there appears to be new cortical thickening and sclerosis involving the right mandible near the ramus. This area is incompletely imaged but appears to be new from 2010. A sclerotic bone lesion cannot be excluded. This could be further evaluated with additional imaging of the mandible.   Electronically Signed   By: Richarda Overlie M.D.   On: 03/05/2014 14:50   Ct Maxillofacial Wo Cm  03/05/2014   CLINICAL DATA:  Pain on both sides of the head. Pain in the temporal areas.  EXAM: CT HEAD WITHOUT CONTRAST  CT MAXILLOFACIAL/SINUS WITHOUT CONTRAST  TECHNIQUE: Multidetector CT imaging of the head and maxillofacial structures were performed using the standard protocol without intravenous contrast. Multiplanar CT image reconstructions of the maxillofacial structures were also generated. The study was ordered as a CT of the sinuses and the maxillofacial images were obtained through the sinuses. The mandible was not completely imaged.  COMPARISON:  Head CT 07/30/2008  FINDINGS: CT HEAD FINDINGS  There is stable mild cerebral atrophy which appears to be age appropriate. There is stable low-density  in the right parietal subcortical white matter. No evidence for acute hemorrhage, mass lesion, midline shift, hydrocephalus or large infarct. Visualized mastoid air cells and paranasal sinuses are clear. No acute bone abnormality.  CT MAXILLOFACIAL FINDINGS  Images through the face and sinuses were obtained. There is mild mucosal disease in the right maxillary sinus. Otherwise, the paranasal sinuses are clear. No significant fluid in the sinuses. There is chronic deformity and depression involving the medial right orbital wall consistent with an old fracture. Mastoid air cells are well aerated. There is a stable area of lucency involving the left frontal bone on sequence 4, image 1.  There is deformity and irregularity of the mandibular  condyles bilaterally. There may be subluxation involving the left TMJ. These TMJ findings are similar to 2010. However, there is sclerosis and cortical thickening involving the the right mandible in the ramus region. This area is incompletely imaged. However, this appears to be a new finding. Pterygoid plates are intact. The globes are intact. Again noted is a small nodule or lymph node in the anterior right parotid gland and this is likely an incidental finding.  IMPRESSION: No acute intracranial abnormality.  Stable atrophy and old white matter disease in the right parietal lobe.  There is mild mucosal disease in the right maxillary sinus. Otherwise, the paranasal sinuses are clear.  There is chronic irregularity and deformity of the mandibular condyles and this could be contributing to bilateral temporal pain. Recommend clinical correlation. In addition, there appears to be new cortical thickening and sclerosis involving the right mandible near the ramus. This area is incompletely imaged but appears to be new from 2010. A sclerotic bone lesion cannot be excluded. This could be further evaluated with additional imaging of the mandible.   Electronically Signed   By: Richarda Overlie M.D.    On: 03/05/2014 14:50     EKG Interpretation None      MDM   Final diagnoses:  Pain  Other headache syndrome  Red eyes   Nonspecific headache, with red eyes.  Possible mild sinus disease which is most likely allergic mediated.  Incidental sclerotic mandible abnormality, on partial imaging.  The patient is not tender at this site.  Nursing Notes Reviewed/ Care Coordinated Applicable Imaging Reviewed Interpretation of Laboratory Data incorporated into ED treatment  The patient appears reasonably screened and/or stabilized for discharge and I doubt any other medical condition or other [facility] requiring further screening, evaluation, or treatment in the ED at this time prior to discharge.  Plan: Home Medications- Claritin; Home Treatments- rest; return here if the recommended treatment, does not improve the symptoms; Recommended follow up- PCP 1-2 weeks for further evaluation  I personally performed the services described in this documentation, which was scribed in my presence. The recorded information has been reviewed and is accurate.       Blake Melter, MD 03/05/14 [phone]

## 2014-03-05 NOTE — Discharge Instructions (Signed)
Take Tylenol, or Advil, for pain Use Claritin 1 pill each day for 1 or 2 weeks to help with allergic symptoms. See your doctor to discuss further evaluation for your pain. Have your doctor check your right jaw for possible further evaluation of the CAT scan abnormality, which was seen today.    General Headache Without Cause A headache is pain or discomfort felt around the head or neck area. The specific cause of a headache may not be found. There are many causes and types of headaches. A few common ones are:  Tension headaches.  Migraine headaches.  Cluster headaches.  Chronic daily headaches. HOME CARE INSTRUCTIONS   Keep all follow-up appointments with your caregiver or any specialist referral.  Only take over-the-counter or prescription medicines for pain or discomfort as directed by your caregiver.  Lie down in a dark, quiet room when you have a headache.  Keep a headache journal to find out what may trigger your migraine headaches. For example, write down:  What you eat and drink.  How much sleep you get.  Any change to your diet or medicines.  Try massage or other relaxation techniques.  Put ice packs or heat on the head and neck. Use these 3 to 4 times per day for 15 to 20 minutes each time, or as needed.  Limit stress.  Sit up straight, and do not tense your muscles.  Quit smoking if you smoke.  Limit alcohol use.  Decrease the amount of caffeine you drink, or stop drinking caffeine.  Eat and sleep on a regular schedule.  Get 7 to 9 hours of sleep, or as recommended by your caregiver.  Keep lights dim if bright lights bother you and make your headaches worse. SEEK MEDICAL CARE IF:   You have problems with the medicines you were prescribed.  Your medicines are not working.  You have a change from the usual headache.  You have nausea or vomiting. SEEK IMMEDIATE MEDICAL CARE IF:   Your headache becomes severe.  You have a fever.  You have a  stiff neck.  You have loss of vision.  You have muscular weakness or loss of muscle control.  You start losing your balance or have trouble walking.  You feel faint or pass out.  You have severe symptoms that are different from your first symptoms. MAKE SURE YOU:   Understand these instructions.  Will watch your condition.  Will get help right away if you are not doing well or get worse. Document Released: 02/25/2005 Document Revised: 05/20/2011 Document Reviewed: 03/13/2011 [facility] Patient Information 2015 [place], [place]. This information is not intended to replace advice given to you by your health care provider. Make sure you discuss any questions you have with your health care provider.

## 2014-03-05 NOTE — ED Notes (Addendum)
Pt reports sharp headaches and intermittent vision changes and generalized weakness, Pt also reports burning and itching in his eyes.

## 2014-06-24 ENCOUNTER — Encounter (HOSPITAL_COMMUNITY): Payer: Self-pay | Admitting: *Deleted

## 2014-06-24 ENCOUNTER — Emergency Department (HOSPITAL_COMMUNITY)
Admission: EM | Admit: 2014-06-24 | Discharge: 2014-06-25 | Disposition: A | Discharge status: Home / Self Care | Payer: Non-veteran care | Attending: Emergency Medicine | Admitting: Emergency Medicine

## 2014-06-24 DIAGNOSIS — R600 Localized edema: Secondary | ICD-10-CM | POA: Diagnosis not present

## 2014-06-24 DIAGNOSIS — Z79899 Other long term (current) drug therapy: Secondary | ICD-10-CM | POA: Insufficient documentation

## 2014-06-24 DIAGNOSIS — E119 Type 2 diabetes mellitus without complications: Secondary | ICD-10-CM | POA: Diagnosis not present

## 2014-06-24 DIAGNOSIS — N400 Enlarged prostate without lower urinary tract symptoms: Secondary | ICD-10-CM | POA: Diagnosis not present

## 2014-06-24 DIAGNOSIS — Z7982 Long term (current) use of aspirin: Secondary | ICD-10-CM | POA: Insufficient documentation

## 2014-06-24 DIAGNOSIS — B351 Tinea unguium: Secondary | ICD-10-CM | POA: Diagnosis not present

## 2014-06-24 DIAGNOSIS — R0602 Shortness of breath: Secondary | ICD-10-CM | POA: Diagnosis not present

## 2014-06-24 DIAGNOSIS — Z8659 Personal history of other mental and behavioral disorders: Secondary | ICD-10-CM | POA: Diagnosis not present

## 2014-06-24 DIAGNOSIS — I252 Old myocardial infarction: Secondary | ICD-10-CM | POA: Diagnosis not present

## 2014-06-24 DIAGNOSIS — M1990 Unspecified osteoarthritis, unspecified site: Secondary | ICD-10-CM | POA: Insufficient documentation

## 2014-06-24 DIAGNOSIS — R609 Edema, unspecified: Secondary | ICD-10-CM

## 2014-06-24 DIAGNOSIS — R2243 Localized swelling, mass and lump, lower limb, bilateral: Secondary | ICD-10-CM | POA: Diagnosis present

## 2014-06-24 NOTE — ED Notes (Addendum)
Pt states he has had leg swelling off and on x 1 yr and goes to the [place]. Pt states sometimes his veins swell in his temple area. Pt states his daughter came to visit and took him to spend the night with his wife at the nursing home. Pt believes his swelling may have a worsened a little do to him not having his feet propped up. Pt states his daughter made him come tonight. Daughter states pt has seemed a little confused today as well and this is not normal for him.

## 2014-06-25 ENCOUNTER — Emergency Department (HOSPITAL_COMMUNITY): Payer: Non-veteran care

## 2014-06-25 LAB — CBC WITH DIFFERENTIAL/PLATELET
BASOS PCT: 0 % (ref 0–1)
Basophils Absolute: 0 10*3/uL (ref 0.0–0.1)
EOS ABS: 0.3 10*3/uL (ref 0.0–0.7)
Eosinophils Relative: 5 % (ref 0–5)
HCT: 36 % — ABNORMAL LOW (ref 39.0–52.0)
Hemoglobin: 12.1 g/dL — ABNORMAL LOW (ref 13.0–17.0)
LYMPHS ABS: 2.8 10*3/uL (ref 0.7–4.0)
Lymphocytes Relative: 42 % (ref 12–46)
MCH: 30.9 pg (ref 26.0–34.0)
MCHC: 33.6 g/dL (ref 30.0–36.0)
MCV: 91.8 fL (ref 78.0–100.0)
MONO ABS: 0.5 10*3/uL (ref 0.1–1.0)
Monocytes Relative: 7 % (ref 3–12)
Neutro Abs: 3.1 10*3/uL (ref 1.7–7.7)
Neutrophils Relative %: 46 % (ref 43–77)
PLATELETS: 154 10*3/uL (ref 150–400)
RBC: 3.92 MIL/uL — ABNORMAL LOW (ref 4.22–5.81)
RDW: 12.8 % (ref 11.5–15.5)
WBC: 6.7 10*3/uL (ref 4.0–10.5)

## 2014-06-25 LAB — BASIC METABOLIC PANEL
ANION GAP: 9 (ref 5–15)
BUN: 15 mg/dL (ref 6–23)
CALCIUM: 9 mg/dL (ref 8.4–10.5)
CHLORIDE: 104 mmol/L (ref 96–112)
CO2: 28 mmol/L (ref 19–32)
CREATININE: 0.83 mg/dL (ref 0.50–1.35)
GFR, EST AFRICAN AMERICAN: 83 mL/min — AB (ref >90)
GFR, EST NON AFRICAN AMERICAN: 71 mL/min — AB (ref >90)
GLUCOSE: 127 mg/dL — AB (ref 70–99)
Potassium: 3.6 mmol/L (ref 3.5–5.1)
Sodium: 141 mmol/L (ref 135–145)

## 2014-06-25 LAB — TROPONIN I: Troponin I: <0.03 ng/mL (ref <0.031)

## 2014-06-25 LAB — URINALYSIS, ROUTINE W REFLEX MICROSCOPIC
Bilirubin Urine: NEGATIVE
GLUCOSE, UA: NEGATIVE mg/dL
Hgb urine dipstick: NEGATIVE
KETONES UR: NEGATIVE mg/dL
Leukocytes, UA: NEGATIVE
Nitrite: NEGATIVE
PH: 6 (ref 5.0–8.0)
Protein, ur: NEGATIVE mg/dL
Specific Gravity, Urine: <1.005 — ABNORMAL LOW (ref 1.005–1.030)
Urobilinogen, UA: 1 mg/dL (ref 0.0–1.0)

## 2014-06-25 LAB — BRAIN NATRIURETIC PEPTIDE: B Natriuretic Peptide: 64 pg/mL (ref 0.0–100.0)

## 2014-06-25 MED ORDER — FUROSEMIDE 20 MG PO TABS: ORAL_TABLET | ORAL | Status: AC

## 2014-06-25 MED ORDER — FUROSEMIDE 10 MG/ML IJ SOLN
20.0000 mg | Freq: Once | INTRAMUSCULAR | Status: AC
Start: 2014-06-25 — End: 2014-06-25
  Administered 2014-06-25: 20 mg via INTRAVENOUS
  Filled 2014-06-25: qty 2

## 2014-06-25 NOTE — ED Provider Notes (Signed)
CSN: [id number]     Arrival date & time 06/24/14  2047 History  This chart was scribed for Blake Libra, MD by Murriel Hopper, ED Scribe. This patient was seen in room APA03/AP[facility]03 and the patient's care was started at 12:36 AM.    Chief Complaint  Patient presents with  . Leg Swelling      The history is provided by the patient and a relative. No language interpreter was used.     HPI Comments: Blake Hester is a 81 y.o. male who presents to the Emergency Department complaining of bilateral intermittent leg swelling that has been present for a year. His daughter states that his ankles swell occasionally but his ankles were more swollen today than she has ever seen. This has improved with elevation. Pt states that he does not experience pain when his legs swell, just "tightness". Pt also complains that he has chronic pain in his toes, and states that his left great toe toenail is falling off. His daughter states that pt had SOB yesterday as well as mild confusion worse than baseline.   Past Medical History  Diagnosis Date  . Diabetes mellitus without complication   . Anxiety   . BPH (benign prostatic hyperplasia)   . Myocardial infarction 07/2013    NSTEMI  . Chest tightness   . Arthritis     LEGS & HANDS   Past Surgical History  Procedure Laterality Date  . Cholecystectomy    . Lung surgery Left    Family History  Problem Relation Age of Onset  . Tuberculosis Mother   . Tuberculosis Father    History  Substance Use Topics  . Smoking status: Never Smoker   . Smokeless tobacco: Never Used  . Alcohol Use: No    Review of Systems  All other systems reviewed and are negative.   Allergies  Review of patient's allergies indicates no known allergies.  Home Medications   Prior to Admission medications   Medication Sig Start Date End Date Taking? Authorizing Provider  aspirin EC 81 MG EC tablet Take 1 tablet (81 mg total) by mouth daily. 07/23/13   Dwana Melena, PA-C   atorvastatin (LIPITOR) 20 MG tablet Take 1 tablet (20 mg total) by mouth daily at 6 PM. 02/15/14   Jodelle Gross, NP  donepezil (ARICEPT) 10 MG tablet Take 10 mg by mouth at bedtime.    Historical Provider, MD  finasteride (PROSCAR) 5 MG tablet Take 1 tablet (5 mg total) by mouth daily. 07/02/13   Gwenyth Bender, NP  gabapentin (NEURONTIN) 400 MG capsule Take 1 capsule (400 mg total) by mouth at bedtime. 07/02/13   Gwenyth Bender, NP  HYDROcodone-acetaminophen (NORCO/VICODIN) 5-325 MG per tablet Take 1-2 tablets by mouth every 4 (four) hours as needed for moderate pain. 07/02/13   Gwenyth Bender, NP  metoprolol tartrate (LOPRESSOR) 25 MG tablet Take 0.5 tablets (12.5 mg total) by mouth 2 (two) times daily. 07/23/13   Dwana Melena, PA-C  nitroGLYCERIN (NITROSTAT) 0.4 MG SL tablet Place 1 tablet (0.4 mg total) under the tongue every 5 (five) minutes x 3 doses as needed for chest pain. 07/23/13   Dwana Melena, PA-C  pantoprazole (PROTONIX) 40 MG tablet Take 1 tablet (40 mg total) by mouth daily at 6 (six) AM. Patient not taking: Reported on 03/05/2014 07/23/13   Dwana Melena, PA-C  PRESCRIPTION MEDICATION Take 1 tablet by mouth 2 (two) times daily. Diabetic medicine.  Large white pill.  Historical Provider, MD  sertraline (ZOLOFT) 25 MG tablet Take 0.5 tablets (12.5 mg total) by mouth daily. 07/02/13   Gwenyth Bender, NP  tamsulosin (FLOMAX) 0.4 MG CAPS capsule Take 1 capsule (0.4 mg total) by mouth daily. 07/02/13   Gwenyth Bender, NP   BP 147/59 mmHg  Pulse 60  Temp(Src) 98.4 F (36.9 C) (Oral)  Resp 20  Ht 5' 11.5" (1.816 m)  Wt 170 lb (77.111 kg)  BMI 23.38 kg/m2  SpO2 97%   Physical Exam  General: Well-developed, well-nourished male in no acute distress; appearance consistent with age of record HENT: normocephalic; atraumatic Eyes: pupils sluggish with surgical changes including anisocoria and lens implants; arcus senilis bilaterally; bilateral medial pterygium; extraocular muscles  intact Neck: supple Heart: regular rate and rhythm; no murmur Lungs: clear to auscultation bilaterally Abdomen: soft; nondistended; suprapubic tenderness; no masses or hepatosplenomegaly; bowel sounds present Extremities: No acute deformity; full range of motion; pulses normal; +1 pitting edema of lower legs Neurologic: Awake, alert and oriented; motor function intact in all extremities and symmetric; no facial droop Skin: Warm and dry; thickening of toenails with absence of the distal aspect of the left great toenail Psychiatric: Normal mood and affect   ED Course  Procedures (including critical care time)  DIAGNOSTIC STUDIES: Oxygen Saturation is 97% on room air, normal by my interpretation.    COORDINATION OF CARE: 12:42 AM Discussed treatment plan with pt at bedside and pt agreed to plan.   MDM   Nursing notes and vitals signs, including pulse oximetry, reviewed.  Summary of this visit's results, reviewed by myself:   EKG Interpretation  Date/Time:  Saturday June 25 2014 00:59:07 EDT Ventricular Rate:  65 PR Interval:  117 QRS Duration: 99 QT Interval:  431 QTC Calculation: 448 R Axis:   48 Text Interpretation:  Sinus or ectopic atrial rhythm Borderline short PR interval Abnormal R-wave progression, early transition Previously a-fib with PVCs Confirmed by Seraj Dunnam  MD, Jonny Ruiz ([id number]) on 06/25/2014 1:02:31 AM       Labs:  Results for orders placed or performed during the hospital encounter of 06/24/14 (from the past 24 hour(s))  Urinalysis, Routine w reflex microscopic     Status: Abnormal   Collection Time: 06/25/14 12:04 AM  Result Value Ref Range   Color, Urine YELLOW YELLOW   APPearance CLEAR CLEAR   Specific Gravity, Urine <1.005 (L) 1.005 - 1.030   pH 6.0 5.0 - 8.0   Glucose, UA NEGATIVE NEGATIVE mg/dL   Hgb urine dipstick NEGATIVE NEGATIVE   Bilirubin Urine NEGATIVE NEGATIVE   Ketones, ur NEGATIVE NEGATIVE mg/dL   Protein, ur NEGATIVE NEGATIVE mg/dL    Urobilinogen, UA 1.0 0.0 - 1.0 mg/dL   Nitrite NEGATIVE NEGATIVE   Leukocytes, UA NEGATIVE NEGATIVE  Troponin I     Status: None   Collection Time: 06/25/14  1:00 AM  Result Value Ref Range   Troponin I <0.03 <0.031 ng/mL  CBC with Differential/Platelet     Status: Abnormal   Collection Time: 06/25/14  1:00 AM  Result Value Ref Range   WBC 6.7 4.0 - 10.5 K/uL   RBC 3.92 (L) 4.22 - 5.81 MIL/uL   Hemoglobin 12.1 (L) 13.0 - 17.0 g/dL   HCT 96.2 (L) 95.2 - 84.1 %   MCV 91.8 78.0 - 100.0 fL   MCH 30.9 26.0 - 34.0 pg   MCHC 33.6 30.0 - 36.0 g/dL   RDW 32.4 40.1 - 02.7 %   Platelets 154 150 -  400 K/uL   Neutrophils Relative % 46 43 - 77 %   Neutro Abs 3.1 1.7 - 7.7 K/uL   Lymphocytes Relative 42 12 - 46 %   Lymphs Abs 2.8 0.7 - 4.0 K/uL   Monocytes Relative 7 3 - 12 %   Monocytes Absolute 0.5 0.1 - 1.0 K/uL   Eosinophils Relative 5 0 - 5 %   Eosinophils Absolute 0.3 0.0 - 0.7 K/uL   Basophils Relative 0 0 - 1 %   Basophils Absolute 0.0 0.0 - 0.1 K/uL  Basic metabolic panel     Status: Abnormal   Collection Time: 06/25/14  1:00 AM  Result Value Ref Range   Sodium 141 135 - 145 mmol/L   Potassium 3.6 3.5 - 5.1 mmol/L   Chloride 104 96 - 112 mmol/L   CO2 28 19 - 32 mmol/L   Glucose, Bld 127 (H) 70 - 99 mg/dL   BUN 15 6 - 23 mg/dL   Creatinine, Ser 4.09 0.50 - 1.35 mg/dL   Calcium 9.0 8.4 - 81.1 mg/dL   GFR calc non Af Amer 71 (L) >90 mL/min   GFR calc Af Amer 83 (L) >90 mL/min   Anion gap 9 5 - 15  Brain natriuretic peptide     Status: None   Collection Time: 06/25/14  1:05 AM  Result Value Ref Range   B Natriuretic Peptide 64.0 0.0 - 100.0 pg/mL    Imaging Studies: Dg Chest 2 View  06/25/2014   CLINICAL DATA:  Bilateral lower extremity swelling  EXAM: CHEST  2 VIEW  COMPARISON:  10/07/2013  FINDINGS: Trace left pleural effusion. There is no edema, consolidation, or pneumothorax. Normal heart size and aortic contours. Syndesmophytes of the thoracic spine.  IMPRESSION: 1.  Trace left pleural effusion without pneumonia or edema. 2. 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   Electronically Signed   By: Marnee Spring M.D.   On: 06/25/2014 02:11    I personally performed the services described in this documentation, which was scribed in my presence. The recorded information has been reviewed and is accurate.   Blake Libra, MD 06/25/14 [phone]

## 2014-09-19 ENCOUNTER — Encounter: Payer: Self-pay | Admitting: Cardiovascular Disease

## 2014-09-19 ENCOUNTER — Ambulatory Visit (INDEPENDENT_AMBULATORY_CARE_PROVIDER_SITE_OTHER): Payer: Non-veteran care | Admitting: Cardiovascular Disease

## 2014-09-19 VITALS — BP 146/68 | HR 90 | Ht 71.0 in | Wt 184.0 lb

## 2014-09-19 DIAGNOSIS — I10 Essential (primary) hypertension: Secondary | ICD-10-CM | POA: Diagnosis not present

## 2014-09-19 DIAGNOSIS — I252 Old myocardial infarction: Secondary | ICD-10-CM | POA: Diagnosis not present

## 2014-09-19 DIAGNOSIS — I2510 Atherosclerotic heart disease of native coronary artery without angina pectoris: Secondary | ICD-10-CM

## 2014-09-19 NOTE — Patient Instructions (Signed)

## 2014-09-19 NOTE — Progress Notes (Signed)
Patient ID: Blake Hester, male   DOB: February 11, 1916, 81 y.o.   MRN: [id number]      SUBJECTIVE: The patient is a 81 year old male (WWII veteran) who has a history of coronary artery disease with a non-STEMI in the setting of community-acquired pneumonia in May 2015 which was medically managed.  Troponin peaked at 8.51. He refused stress testing and coronary angiography. Echocardiogram on 07/21/13 demonstrated normal left ventricular systolic function, LVEF 60%, mild LVH, normal regional wall motion, and grade 1 diastolic dysfunction.  He is here with his niece, Blake Hester.  He occasionally has chest pains and takes nitroglycerin. He is apparently very independent and likes to drive. He served in [place], [place], and [place]. He also served in [employer].   Review of Systems: As per "subjective", otherwise negative.  No Known Allergies  Current Outpatient Prescriptions  Medication Sig Dispense Refill  . aspirin EC 81 MG EC tablet Take 1 tablet (81 mg total) by mouth daily.    Marland Kitchen atorvastatin (LIPITOR) 20 MG tablet Take 1 tablet (20 mg total) by mouth daily at 6 PM. 30 tablet 5  . donepezil (ARICEPT) 10 MG tablet Take 10 mg by mouth at bedtime.    . finasteride (PROSCAR) 5 MG tablet Take 1 tablet (5 mg total) by mouth daily.    . furosemide (LASIX) 20 MG tablet Take 1 tablet daily as needed for leg swelling. 15 tablet 0  . gabapentin (NEURONTIN) 400 MG capsule Take 1 capsule (400 mg total) by mouth at bedtime.    Marland Kitchen HYDROcodone-acetaminophen (NORCO/VICODIN) 5-325 MG per tablet Take 1-2 tablets by mouth every 4 (four) hours as needed for moderate pain. 30 tablet 0  . metoprolol tartrate (LOPRESSOR) 25 MG tablet Take 0.5 tablets (12.5 mg total) by mouth 2 (two) times daily. 60 tablet 5  . nitroGLYCERIN (NITROSTAT) 0.4 MG SL tablet Place 1 tablet (0.4 mg total) under the tongue every 5 (five) minutes x 3 doses as needed for chest pain. 25 tablet 12  . sertraline (ZOLOFT) 25 MG tablet Take 0.5 tablets  (12.5 mg total) by mouth daily.    . tamsulosin (FLOMAX) 0.4 MG CAPS capsule Take 1 capsule (0.4 mg total) by mouth daily. 30 capsule   . PRESCRIPTION MEDICATION Take 1 tablet by mouth 2 (two) times daily. Diabetic medicine.  Large white pill.    . [DISCONTINUED] pantoprazole (PROTONIX) 40 MG tablet Take 1 tablet (40 mg total) by mouth daily at 6 (six) AM. (Patient not taking: Reported on 03/05/2014) 30 tablet 5   No current facility-administered medications for this visit.    Past Medical History  Diagnosis Date  . Diabetes mellitus without complication   . Anxiety   . BPH (benign prostatic hyperplasia)   . Myocardial infarction 07/2013    NSTEMI  . Chest tightness   . Arthritis     LEGS & HANDS    Past Surgical History  Procedure Laterality Date  . Cholecystectomy    . Lung surgery Left     History   Social History  . Marital Status: Married    Spouse Name: N/A  . Number of Children: N/A  . Years of Education: N/A   Occupational History  . Not on file.   Social History Main Topics  . Smoking status: Never Smoker   . Smokeless tobacco: Never Used  . Alcohol Use: No  . Drug Use: No  . Sexual Activity: No   Other Topics Concern  . Not on file   Social  History Narrative   Married WW2 veteran     Filed Vitals:   09/19/14 1100  BP: 146/68  Pulse: 90  Height: 5\' 11"  (1.803 m)  Weight: 184 lb (83.462 kg)  SpO2: 96%    PHYSICAL EXAM General: NAD HEENT: Normal. Neck: No JVD, no thyromegaly. Lungs: Clear to auscultation bilaterally with normal respiratory effort. CV: Nondisplaced PMI.  Regular rate and rhythm, normal S1/S2, no S3/S4, no murmur. No pretibial or periankle edema.  Abdomen: Soft, no distention.  Neurologic: Alert and oriented.  Psych: Normal affect. Skin: Normal. Musculoskeletal: No gross deformities. Extremities: No clubbing or cyanosis.   ECG: Most recent ECG reviewed.      ASSESSMENT AND PLAN: 1. CAD: Relatively stable overall.  Normal LV systolic function and regional wall motion in 07/2013. Continue ASA, SL nitroglycerin, Lipitor, and metoprolol.  2. Essential HTN: Reasonably well controlled for age. No changes.  Dispo: f/u 6 months.   Prentice Docker, M.D., F.A.C.C.

## 2015-08-09 IMAGING — CR DG CHEST 1V
1 series · 1 of 1 positions shown · non-contrast
Comparison: Prior chest x-ray 03/17/2010

CLINICAL DATA: Cough

EXAM:
CHEST - 1 VIEW

[view not recorded]
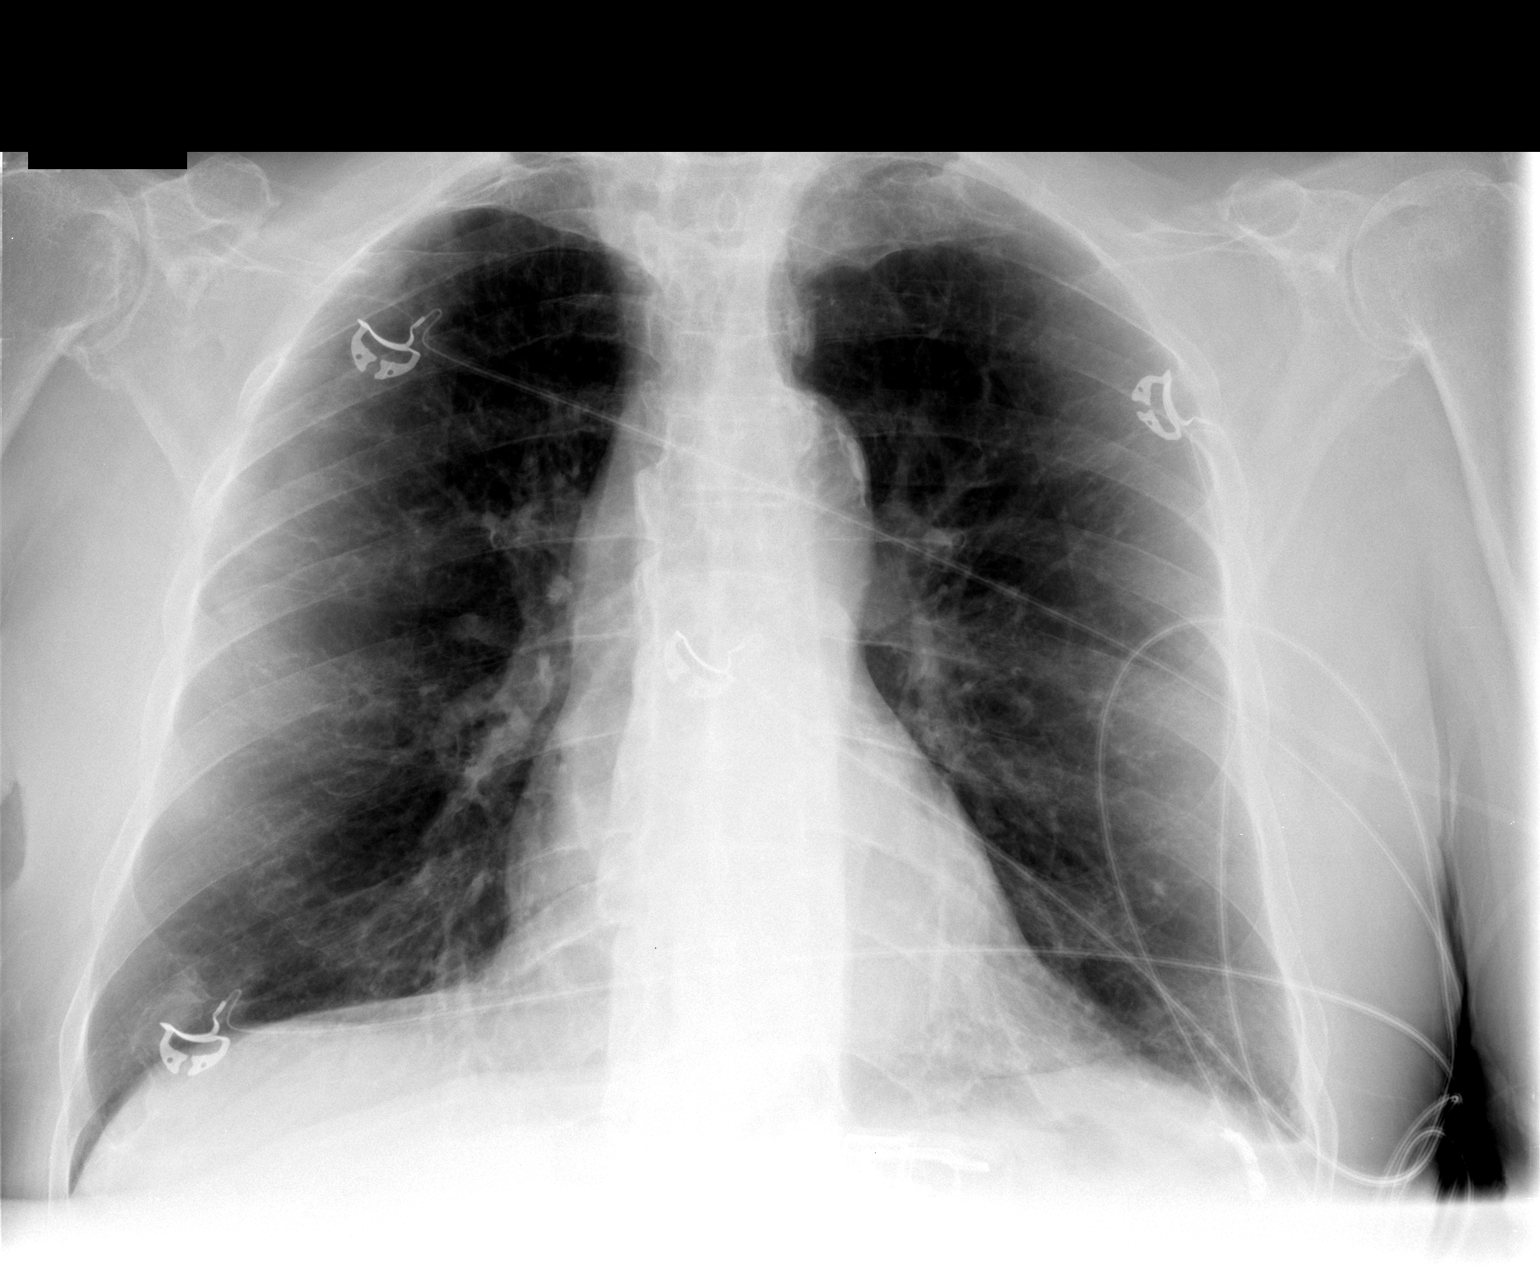

[1 of 1 positions shown; findings below may reference images not displayed]

FINDINGS: Stable cardiac and mediastinal contours. Atherosclerotic
calcification noted in the transverse aorta. New small left pleural
effusion with associated basilar opacity. Negative for pneumothorax
or pleural effusion. No suspicious nodule or mass. No acute osseous
abnormality.
IMPRESSION: 1. Small left pleural effusion and associated left basilar opacity
which may reflect atelectasis or infiltrate.
2. Aortic atherosclerosis.

## 2016-04-12 IMAGING — CT CT MAXILLOFACIAL W/O CM
3 of 4 series · 11 of 47 positions shown, 13 images · non-contrast
Comparison: Head CT 07/30/2008

CLINICAL DATA: Pain on both sides of the head. Pain in the temporal
areas.

EXAM:
CT HEAD WITHOUT CONTRAST
CT MAXILLOFACIAL/SINUS WITHOUT CONTRAST
TECHNIQUE: Multidetector CT imaging of the head and maxillofacial structures
were performed using the standard protocol without intravenous
contrast. Multiplanar CT image reconstructions of the maxillofacial
structures were also generated. The study was ordered as a CT of the
sinuses and the maxillofacial images were obtained through the
sinuses. The mandible was not completely imaged.

[Series 2: headseq 4.8 h37s · axial · 0.46mm/px · z∈[+186,+304]mm · 5 of 34 slices shown, 7 images]
[im 7/34  brain]
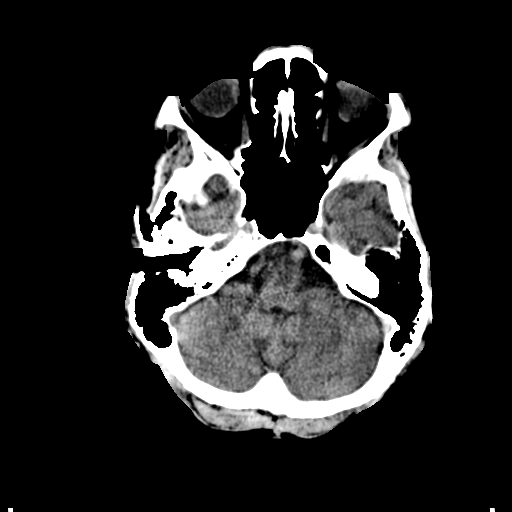
[im 7/34  bone]
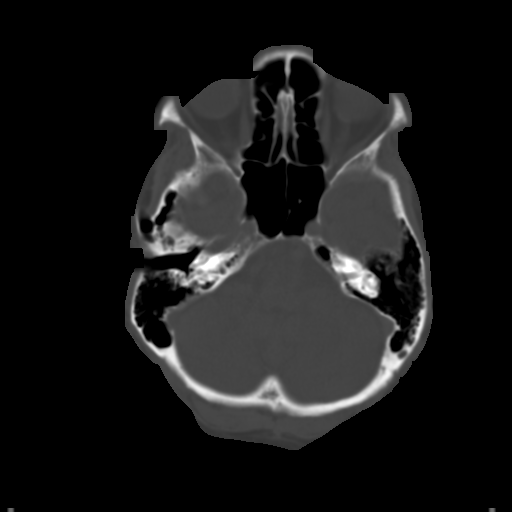
[im 13/34  bone]
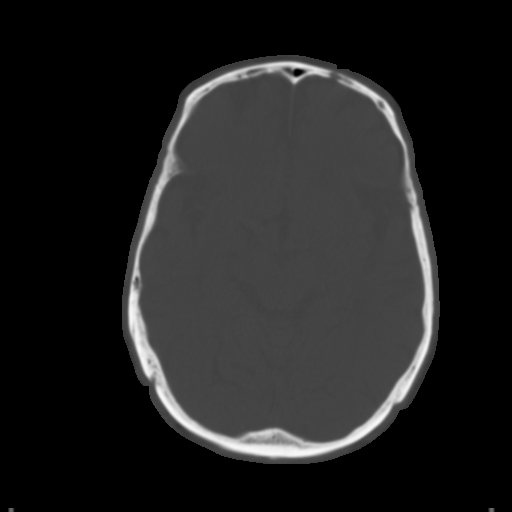
[im 19/34  bone]
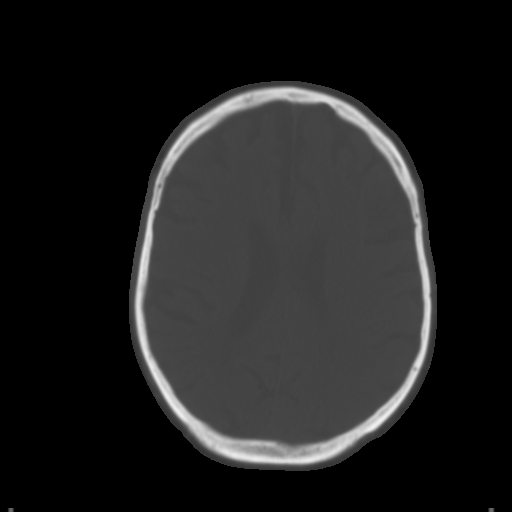
[im 25/34  bone]
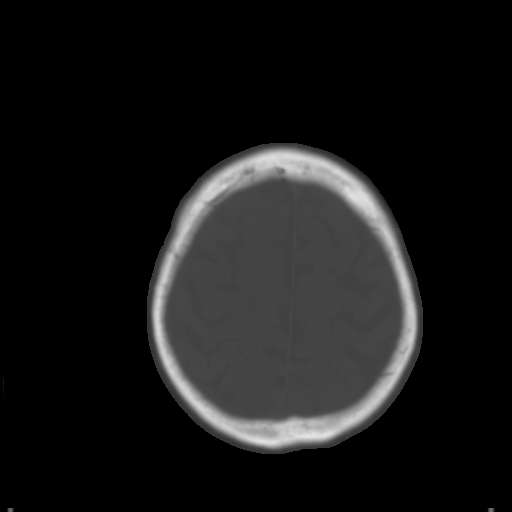
[im 31/34  brain]
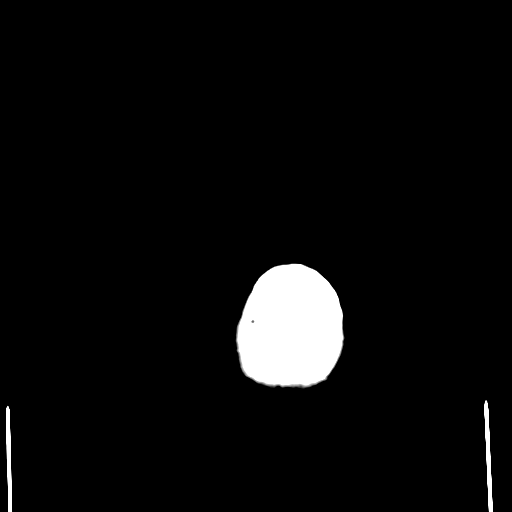
[im 31/34  bone]
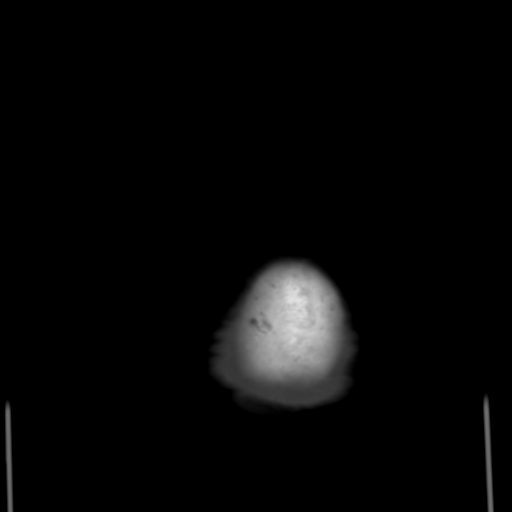

[Series 6: sinus bones coro 2.0 · coronal · 0.21mm/px · 3 of 94 slices shown]
[im 32/94  bone]
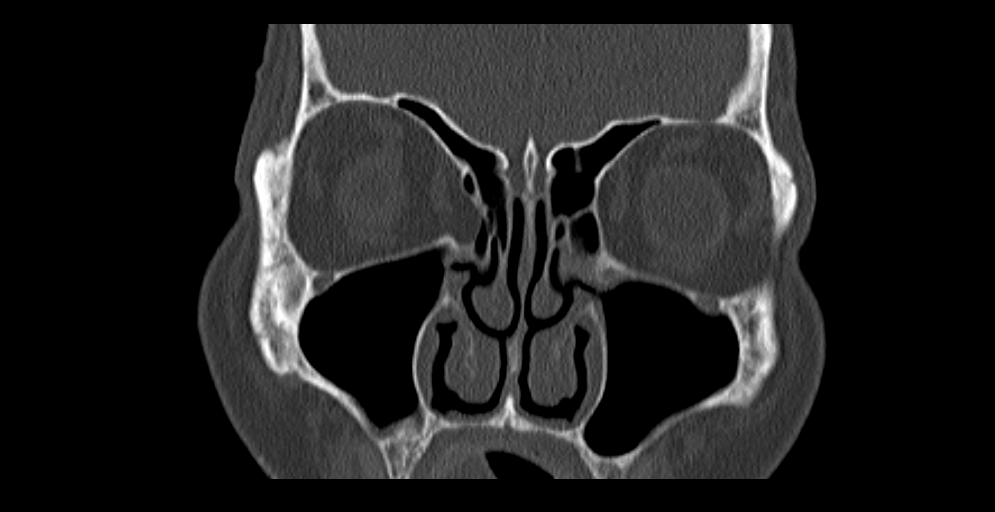
[im 42/94  bone]
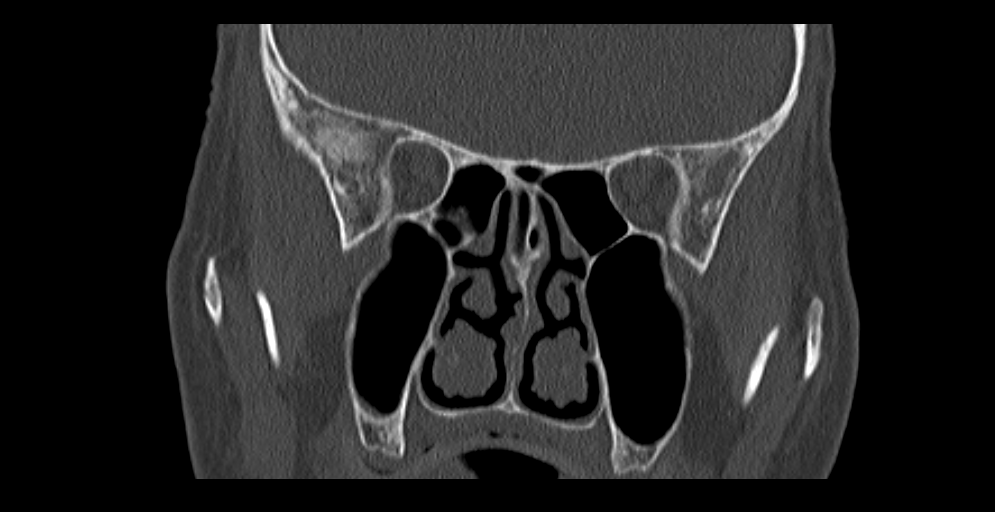
[im 52/94  bone]
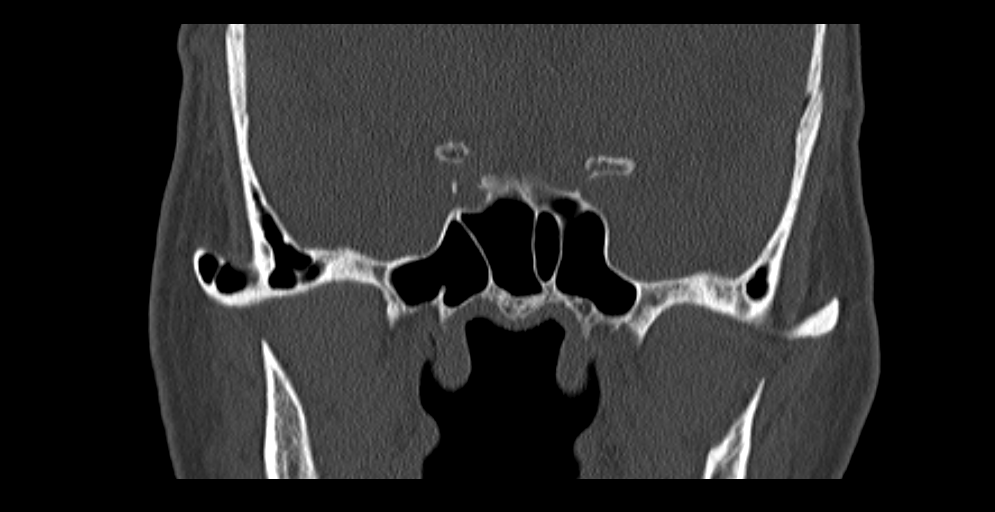

[Series 7: sinus bone sag · sagittal · 0.20mm/px · 3 of 90 slices shown]
[im 30/90  bone]
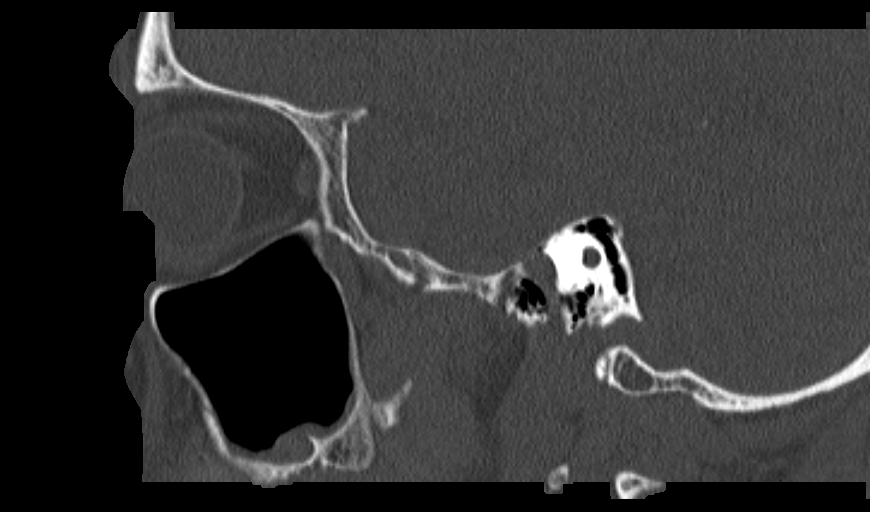
[im 45/90  bone]
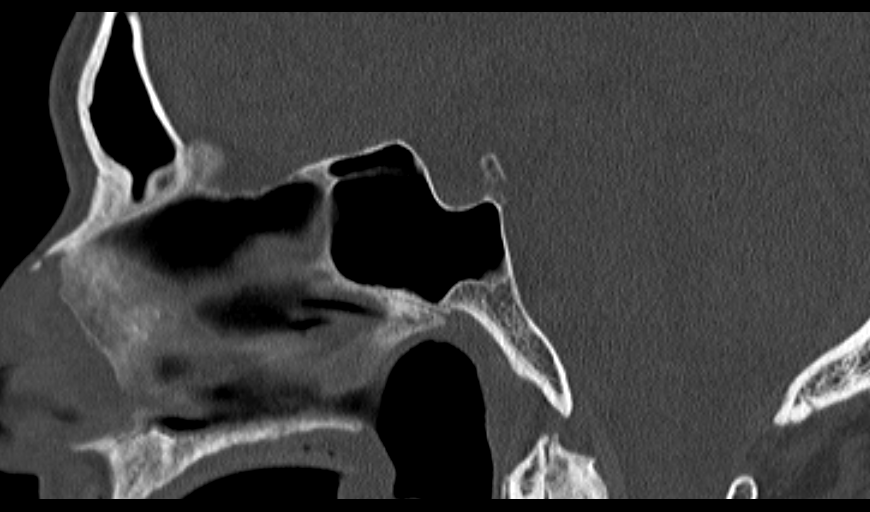
[im 60/90  bone]
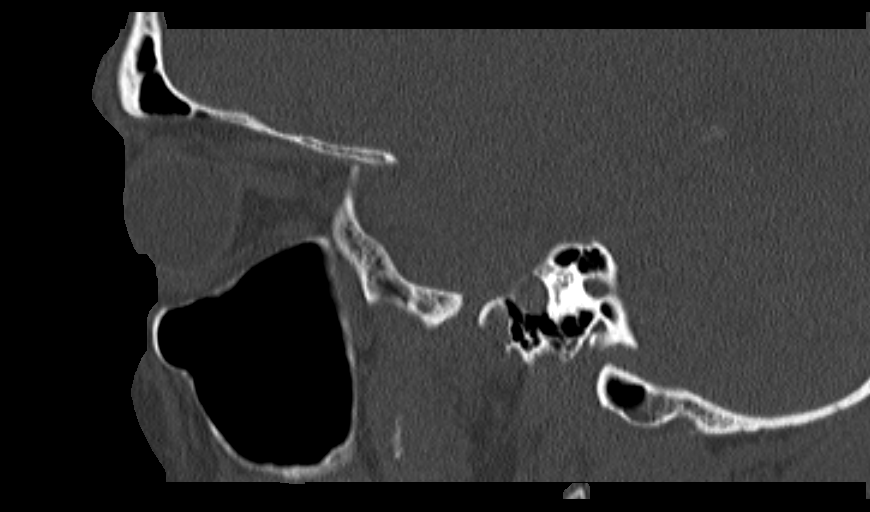

[11 of 47 positions shown; findings below may reference images not displayed]

FINDINGS: CT HEAD FINDINGS

There is stable mild cerebral atrophy which appears to be age
appropriate. There is stable low-density in the right parietal
subcortical white matter. No evidence for acute hemorrhage, mass
lesion, midline shift, hydrocephalus or large infarct. Visualized
mastoid air cells and paranasal sinuses are clear. No acute bone
abnormality.

CT MAXILLOFACIAL FINDINGS

Images through the face and sinuses were obtained. There is mild
mucosal disease in the right maxillary sinus. Otherwise, the
paranasal sinuses are clear. No significant fluid in the sinuses.
There is chronic deformity and depression involving the medial right
orbital wall consistent with an old fracture. Mastoid air cells are
well aerated. There is a stable area of lucency involving the left
frontal bone on sequence 4, image 1.

There is deformity and irregularity of the mandibular condyles
bilaterally. There may be subluxation involving the left TMJ. These
TMJ findings are similar to 3323. However, there is sclerosis and
cortical thickening involving the the right mandible in the ramus
region. This area is incompletely imaged. However, this appears to
be a new finding. Pterygoid plates are intact. The globes are
intact. Again noted is a small nodule or lymph node in the anterior
right parotid gland and this is likely an incidental finding.
IMPRESSION: No acute intracranial abnormality.

Stable atrophy and old white matter disease in the right parietal
lobe.

There is mild mucosal disease in the right maxillary sinus.
Otherwise, the paranasal sinuses are clear.

There is chronic irregularity and deformity of the mandibular
condyles and this could be contributing to bilateral temporal pain.
Recommend clinical correlation. In addition, there appears to be new
cortical thickening and sclerosis involving the right mandible near
the ramus. This area is incompletely imaged but appears to be new
from 3323. A sclerotic bone lesion cannot be excluded. This could be
further evaluated with additional imaging of the mandible.

## 2016-08-02 IMAGING — DX DG CHEST 2V
2 series · 2 of 2 positions shown · non-contrast
Comparison: 10/07/2013

CLINICAL DATA: Bilateral lower extremity swelling

EXAM:
CHEST  2 VIEW

[chest pa]
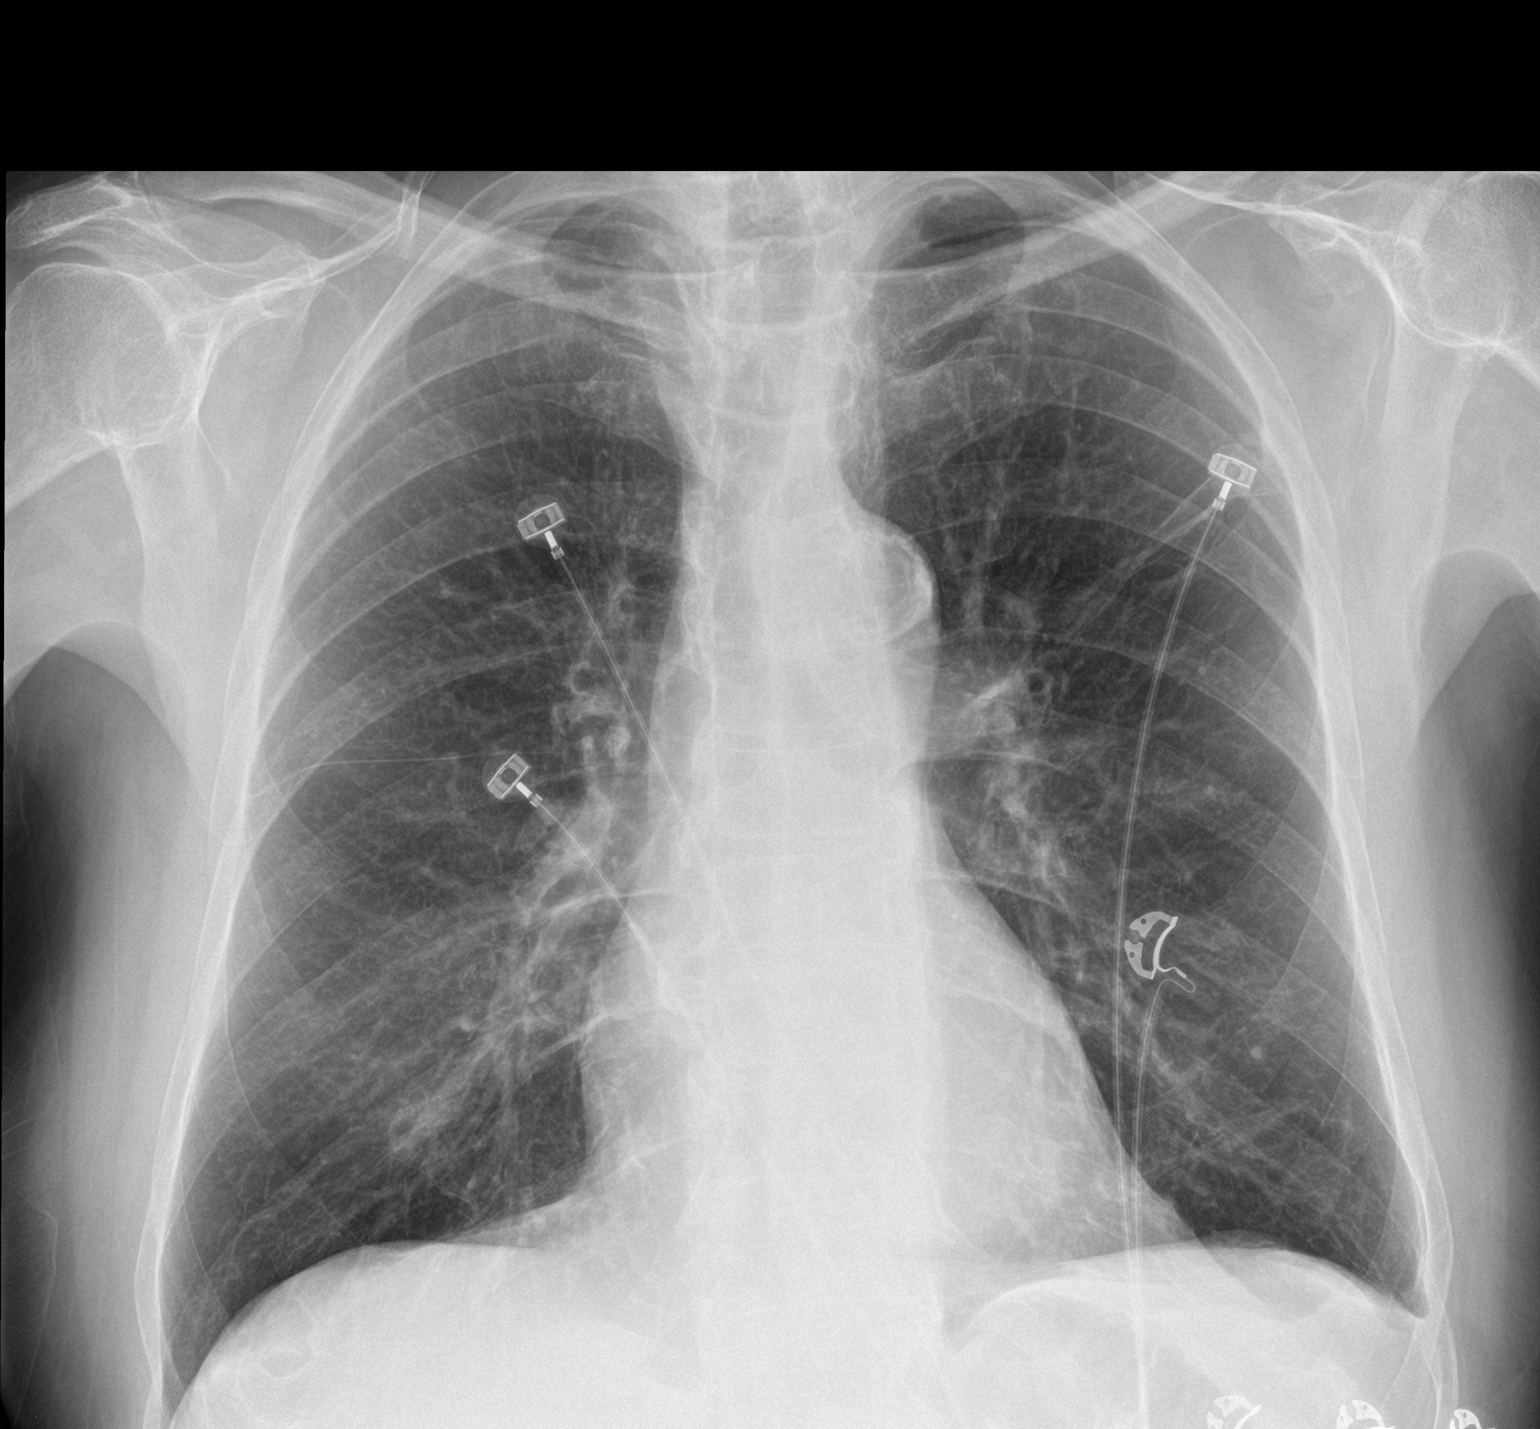

[chest lat]
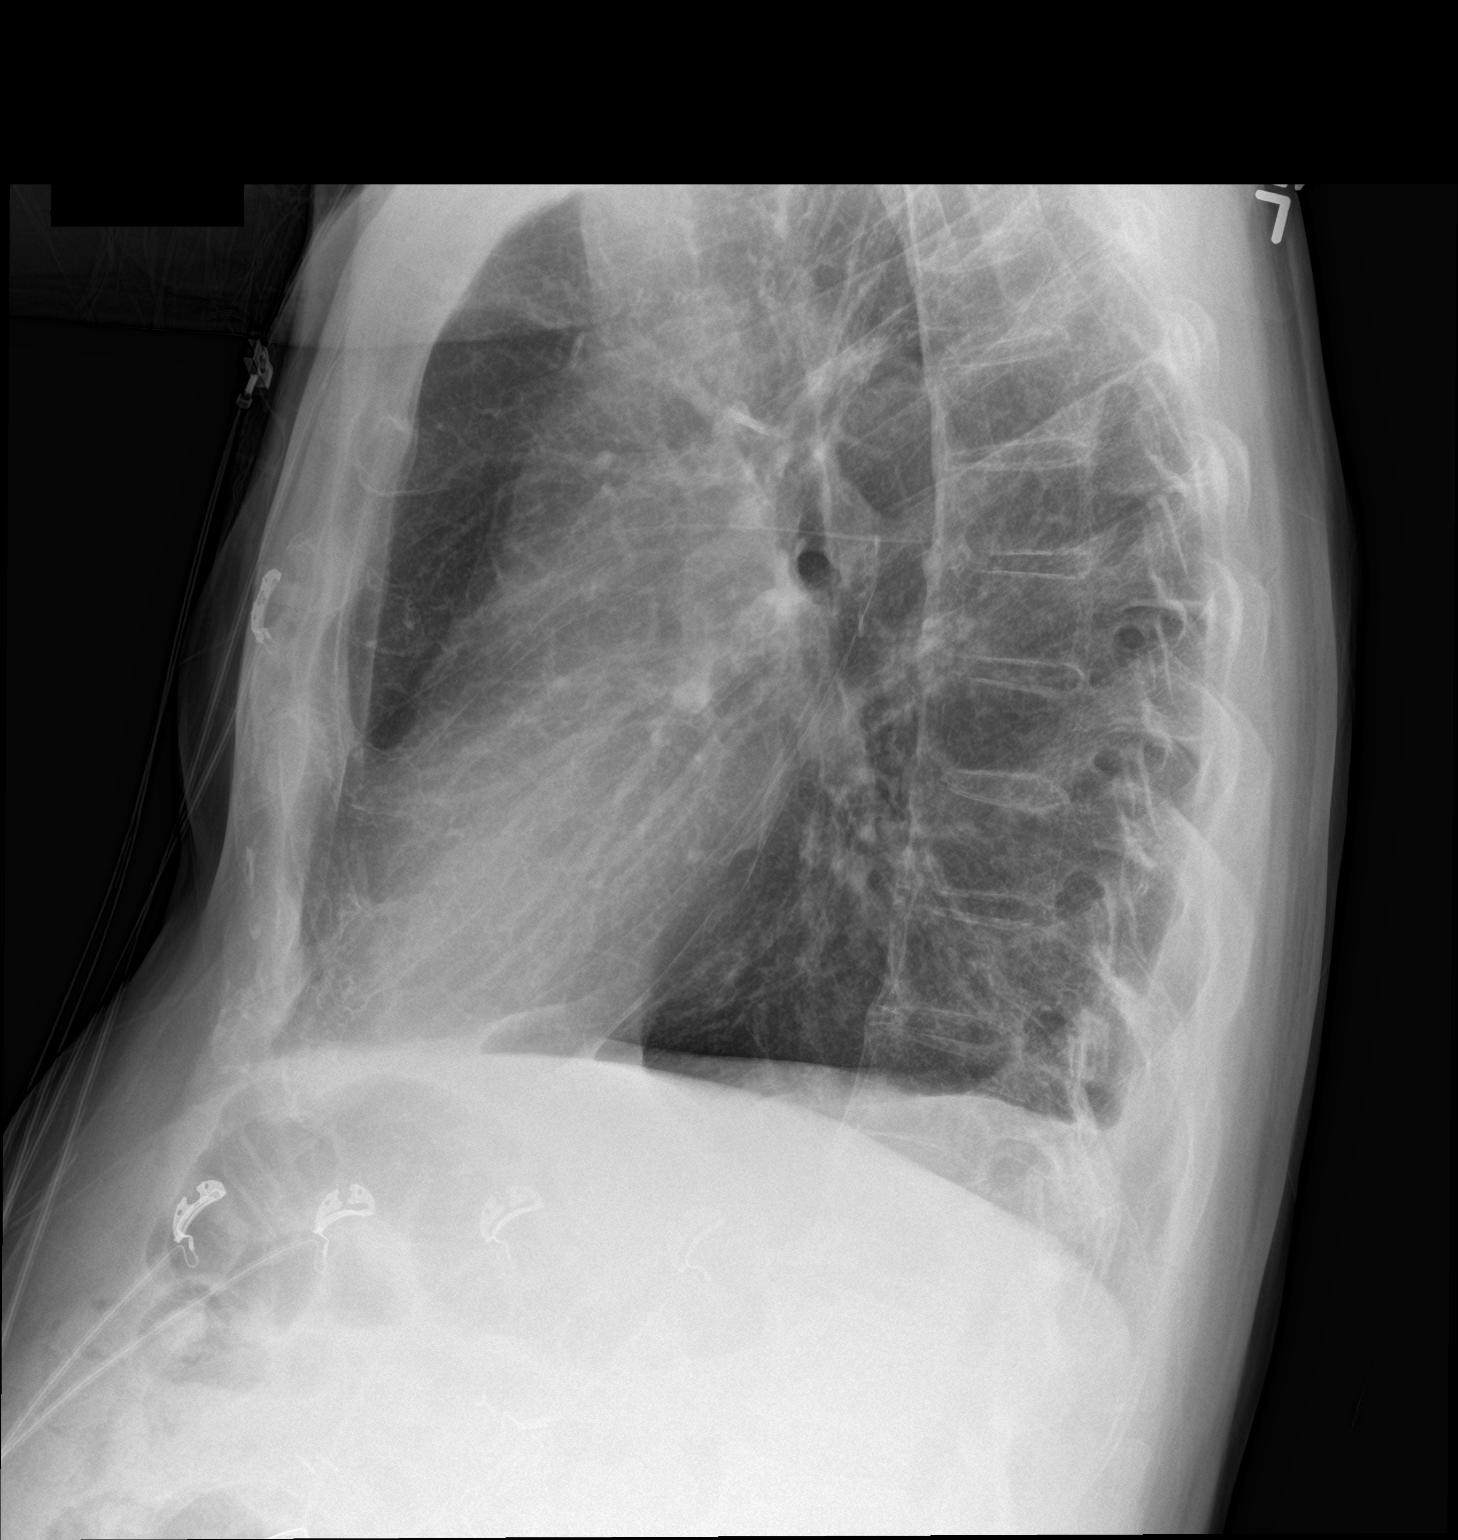

[2 of 2 positions shown; findings below may reference images not displayed]

FINDINGS: Trace left pleural effusion. There is no edema, consolidation, or
pneumothorax. Normal heart size and aortic contours. Syndesmophytes
of the thoracic spine.
IMPRESSION: 1. Trace left pleural effusion without pneumonia or edema.
2. Spondyloarthropathy, likely ankylosing spondylitis.

## 2021-02-08 DEATH — deceased
